# Patient Record
Sex: Male | Born: 1940 | Race: White | Hispanic: No | Marital: Married | Smoking: Current every day smoker
Health system: Southern US, Community
[De-identification: ages and names within clinical notes are randomized; demographics above are authoritative.]

## PROBLEM LIST (undated history)

## (undated) DIAGNOSIS — N200 Calculus of kidney: Secondary | ICD-10-CM

## (undated) DIAGNOSIS — F329 Major depressive disorder, single episode, unspecified: Secondary | ICD-10-CM

## (undated) DIAGNOSIS — F32A Depression, unspecified: Secondary | ICD-10-CM

## (undated) DIAGNOSIS — I639 Cerebral infarction, unspecified: Secondary | ICD-10-CM

## (undated) DIAGNOSIS — I2510 Atherosclerotic heart disease of native coronary artery without angina pectoris: Secondary | ICD-10-CM

## (undated) DIAGNOSIS — E785 Hyperlipidemia, unspecified: Secondary | ICD-10-CM

## (undated) DIAGNOSIS — F4310 Post-traumatic stress disorder, unspecified: Secondary | ICD-10-CM

## (undated) DIAGNOSIS — IMO0002 Reserved for concepts with insufficient information to code with codable children: Secondary | ICD-10-CM

## (undated) DIAGNOSIS — I10 Essential (primary) hypertension: Secondary | ICD-10-CM

## (undated) HISTORY — PX: CORONARY ANGIOPLASTY WITH STENT PLACEMENT: SHX49

---

## 2002-07-22 ENCOUNTER — Encounter: Payer: Self-pay | Admitting: Cardiology

## 2002-07-22 ENCOUNTER — Ambulatory Visit (HOSPITAL_COMMUNITY): Admission: RE | Admit: 2002-07-22 | Discharge: 2002-07-22 | Payer: Self-pay | Admitting: Cardiology

## 2004-05-29 ENCOUNTER — Emergency Department (HOSPITAL_COMMUNITY): Admission: EM | Admit: 2004-05-29 | Discharge: 2004-05-29 | Payer: Self-pay | Admitting: Emergency Medicine

## 2004-07-06 ENCOUNTER — Emergency Department (HOSPITAL_COMMUNITY): Admission: EM | Admit: 2004-07-06 | Discharge: 2004-07-06 | Payer: Self-pay | Admitting: Emergency Medicine

## 2004-07-19 ENCOUNTER — Emergency Department (HOSPITAL_COMMUNITY): Admission: EM | Admit: 2004-07-19 | Discharge: 2004-07-20 | Payer: Self-pay | Admitting: Emergency Medicine

## 2004-07-20 ENCOUNTER — Inpatient Hospital Stay (HOSPITAL_COMMUNITY): Admission: EM | Admit: 2004-07-20 | Discharge: 2004-07-23 | Payer: Self-pay | Admitting: Cardiology

## 2011-09-18 DIAGNOSIS — N200 Calculus of kidney: Secondary | ICD-10-CM

## 2011-09-18 HISTORY — DX: Calculus of kidney: N20.0

## 2011-10-19 ENCOUNTER — Encounter (HOSPITAL_BASED_OUTPATIENT_CLINIC_OR_DEPARTMENT_OTHER): Payer: Self-pay | Admitting: *Deleted

## 2011-10-19 ENCOUNTER — Observation Stay (HOSPITAL_BASED_OUTPATIENT_CLINIC_OR_DEPARTMENT_OTHER)
Admission: EM | Admit: 2011-10-19 | Discharge: 2011-10-24 | Disposition: A | Discharge status: Home / Self Care | Payer: Medicare Other | Attending: Family Medicine | Admitting: Family Medicine

## 2011-10-19 ENCOUNTER — Emergency Department (INDEPENDENT_AMBULATORY_CARE_PROVIDER_SITE_OTHER): Payer: Medicare Other

## 2011-10-19 ENCOUNTER — Other Ambulatory Visit: Payer: Self-pay

## 2011-10-19 DIAGNOSIS — F419 Anxiety disorder, unspecified: Secondary | ICD-10-CM | POA: Diagnosis present

## 2011-10-19 DIAGNOSIS — Z8673 Personal history of transient ischemic attack (TIA), and cerebral infarction without residual deficits: Secondary | ICD-10-CM | POA: Insufficient documentation

## 2011-10-19 DIAGNOSIS — J449 Chronic obstructive pulmonary disease, unspecified: Secondary | ICD-10-CM | POA: Insufficient documentation

## 2011-10-19 DIAGNOSIS — F32A Depression, unspecified: Secondary | ICD-10-CM | POA: Diagnosis present

## 2011-10-19 DIAGNOSIS — N200 Calculus of kidney: Secondary | ICD-10-CM

## 2011-10-19 DIAGNOSIS — I2589 Other forms of chronic ischemic heart disease: Secondary | ICD-10-CM | POA: Insufficient documentation

## 2011-10-19 DIAGNOSIS — I252 Old myocardial infarction: Secondary | ICD-10-CM | POA: Insufficient documentation

## 2011-10-19 DIAGNOSIS — R079 Chest pain, unspecified: Secondary | ICD-10-CM

## 2011-10-19 DIAGNOSIS — Z87442 Personal history of urinary calculi: Secondary | ICD-10-CM | POA: Insufficient documentation

## 2011-10-19 DIAGNOSIS — I959 Hypotension, unspecified: Secondary | ICD-10-CM

## 2011-10-19 DIAGNOSIS — I200 Unstable angina: Secondary | ICD-10-CM | POA: Diagnosis present

## 2011-10-19 DIAGNOSIS — F329 Major depressive disorder, single episode, unspecified: Secondary | ICD-10-CM | POA: Diagnosis present

## 2011-10-19 DIAGNOSIS — Z9861 Coronary angioplasty status: Secondary | ICD-10-CM | POA: Insufficient documentation

## 2011-10-19 DIAGNOSIS — I639 Cerebral infarction, unspecified: Secondary | ICD-10-CM | POA: Diagnosis present

## 2011-10-19 DIAGNOSIS — E785 Hyperlipidemia, unspecified: Secondary | ICD-10-CM | POA: Diagnosis present

## 2011-10-19 DIAGNOSIS — I5020 Unspecified systolic (congestive) heart failure: Secondary | ICD-10-CM | POA: Insufficient documentation

## 2011-10-19 DIAGNOSIS — J45909 Unspecified asthma, uncomplicated: Secondary | ICD-10-CM | POA: Diagnosis present

## 2011-10-19 DIAGNOSIS — J4489 Other specified chronic obstructive pulmonary disease: Secondary | ICD-10-CM | POA: Insufficient documentation

## 2011-10-19 DIAGNOSIS — I2510 Atherosclerotic heart disease of native coronary artery without angina pectoris: Principal | ICD-10-CM

## 2011-10-19 DIAGNOSIS — F4310 Post-traumatic stress disorder, unspecified: Secondary | ICD-10-CM | POA: Diagnosis present

## 2011-10-19 DIAGNOSIS — IMO0002 Reserved for concepts with insufficient information to code with codable children: Secondary | ICD-10-CM | POA: Insufficient documentation

## 2011-10-19 DIAGNOSIS — F3289 Other specified depressive episodes: Secondary | ICD-10-CM | POA: Insufficient documentation

## 2011-10-19 DIAGNOSIS — I10 Essential (primary) hypertension: Secondary | ICD-10-CM | POA: Insufficient documentation

## 2011-10-19 DIAGNOSIS — I059 Rheumatic mitral valve disease, unspecified: Secondary | ICD-10-CM | POA: Insufficient documentation

## 2011-10-19 HISTORY — DX: Atherosclerotic heart disease of native coronary artery without angina pectoris: I25.10

## 2011-10-19 HISTORY — DX: Calculus of kidney: N20.0

## 2011-10-19 HISTORY — DX: Depression, unspecified: F32.A

## 2011-10-19 HISTORY — DX: Hyperlipidemia, unspecified: E78.5

## 2011-10-19 HISTORY — DX: Essential (primary) hypertension: I10

## 2011-10-19 HISTORY — DX: Reserved for concepts with insufficient information to code with codable children: IMO0002

## 2011-10-19 HISTORY — DX: Major depressive disorder, single episode, unspecified: F32.9

## 2011-10-19 HISTORY — DX: Cerebral infarction, unspecified: I63.9

## 2011-10-19 LAB — BASIC METABOLIC PANEL
BUN: 16 mg/dL (ref 6–23)
CO2: 25 mEq/L (ref 19–32)
Calcium: 9.4 mg/dL (ref 8.4–10.5)
Chloride: 103 mEq/L (ref 96–112)
Creatinine, Ser: 1.2 mg/dL (ref 0.50–1.35)
GFR calc Af Amer: 69 mL/min — ABNORMAL LOW (ref >90)
GFR calc non Af Amer: 60 mL/min — ABNORMAL LOW (ref >90)
Glucose, Bld: 123 mg/dL — ABNORMAL HIGH (ref 70–99)
Potassium: 3.5 mEq/L (ref 3.5–5.1)
Sodium: 139 mEq/L (ref 135–145)

## 2011-10-19 LAB — CBC
HCT: 42.3 % (ref 39.0–52.0)
Hemoglobin: 14.6 g/dL (ref 13.0–17.0)
MCH: 30.7 pg (ref 26.0–34.0)
MCHC: 34.5 g/dL (ref 30.0–36.0)
MCV: 89.1 fL (ref 78.0–100.0)
Platelets: 209 10*3/uL (ref 150–400)
RBC: 4.75 MIL/uL (ref 4.22–5.81)
RDW: 13.7 % (ref 11.5–15.5)
WBC: 8.8 10*3/uL (ref 4.0–10.5)

## 2011-10-19 LAB — TROPONIN I: Troponin I: <0.3 ng/mL (ref <0.30)

## 2011-10-19 MED ORDER — ONDANSETRON HCL 4 MG/2ML IJ SOLN: 4.0000 mg | Freq: Four times a day (QID) | INTRAMUSCULAR | Status: DC | PRN

## 2011-10-19 MED ORDER — ACETAMINOPHEN 650 MG RE SUPP: 650.0000 mg | Freq: Four times a day (QID) | RECTAL | Status: DC | PRN

## 2011-10-19 MED ORDER — ONDANSETRON HCL 4 MG PO TABS: 4.0000 mg | ORAL_TABLET | Freq: Four times a day (QID) | ORAL | Status: DC | PRN

## 2011-10-19 MED ORDER — ZOLPIDEM TARTRATE 5 MG PO TABS: 5.0000 mg | ORAL_TABLET | Freq: Every evening | ORAL | Status: DC | PRN

## 2011-10-19 MED ORDER — ALUM & MAG HYDROXIDE-SIMETH 200-200-20 MG/5ML PO SUSP: 30.0000 mL | Freq: Four times a day (QID) | ORAL | Status: DC | PRN

## 2011-10-19 MED ORDER — NITROGLYCERIN 2 % TD OINT
0.5000 [in_us] | TOPICAL_OINTMENT | Freq: Four times a day (QID) | TRANSDERMAL | Status: DC
  Filled 2011-10-19: qty 30

## 2011-10-19 MED ORDER — ACETAMINOPHEN 325 MG PO TABS
650.0000 mg | ORAL_TABLET | Freq: Four times a day (QID) | ORAL | Status: DC | PRN
  Filled 2011-10-19: qty 2

## 2011-10-19 MED ORDER — MORPHINE SULFATE 4 MG/ML IJ SOLN
4.0000 mg | Freq: Once | INTRAMUSCULAR | Status: AC
  Administered 2011-10-19: 4 mg via INTRAVENOUS
  Filled 2011-10-19: qty 1

## 2011-10-19 MED ORDER — SODIUM CHLORIDE 0.9 % IV SOLN: INTRAVENOUS | Status: DC

## 2011-10-19 MED ORDER — HYDROMORPHONE HCL PF 1 MG/ML IJ SOLN
0.5000 mg | INTRAMUSCULAR | Status: DC | PRN
  Administered 2011-10-20: 0.5 mg via INTRAVENOUS
  Administered 2011-10-21: 1 mg via INTRAVENOUS
  Filled 2011-10-19 (×2): qty 1

## 2011-10-19 MED ORDER — ENOXAPARIN SODIUM 40 MG/0.4ML ~~LOC~~ SOLN
40.0000 mg | SUBCUTANEOUS | Status: DC
  Administered 2011-10-19: 40 mg via SUBCUTANEOUS
  Filled 2011-10-19 (×2): qty 0.4

## 2011-10-19 MED ORDER — SODIUM CHLORIDE 0.9 % IV SOLN
INTRAVENOUS | Status: DC
  Administered 2011-10-19 – 2011-10-20 (×3): via INTRAVENOUS

## 2011-10-19 MED ORDER — ASPIRIN 81 MG PO CHEW
324.0000 mg | CHEWABLE_TABLET | Freq: Once | ORAL | Status: AC
  Administered 2011-10-19: 324 mg via ORAL
  Filled 2011-10-19: qty 4

## 2011-10-19 MED ORDER — OXYCODONE HCL 5 MG PO TABS
5.0000 mg | ORAL_TABLET | ORAL | Status: DC | PRN
  Administered 2011-10-19 – 2011-10-24 (×21): 5 mg via ORAL
  Filled 2011-10-19 (×21): qty 1

## 2011-10-19 MED ORDER — ONDANSETRON HCL 4 MG/2ML IJ SOLN: 4.0000 mg | Freq: Three times a day (TID) | INTRAMUSCULAR | Status: DC | PRN

## 2011-10-19 MED ORDER — ASPIRIN EC 325 MG PO TBEC
325.0000 mg | DELAYED_RELEASE_TABLET | Freq: Every day | ORAL | Status: DC
  Administered 2011-10-20 – 2011-10-24 (×5): 325 mg via ORAL
  Filled 2011-10-19 (×5): qty 1

## 2011-10-19 MED ORDER — NITROGLYCERIN 0.2 MG/HR TD PT24
0.2000 mg | MEDICATED_PATCH | Freq: Every day | TRANSDERMAL | Status: DC
  Administered 2011-10-19 – 2011-10-24 (×6): 0.2 mg via TRANSDERMAL
  Filled 2011-10-19 (×6): qty 1

## 2011-10-19 NOTE — ED Notes (Signed)
[facility]ia [facility]--spoke with Mitchell--Hospitalist-for dr. Juleen China

## 2011-10-19 NOTE — ED Notes (Signed)
Chest pain onset 3 hours ago 

## 2011-10-19 NOTE — ED Notes (Signed)
Report called to RN on [id number].

## 2011-10-19 NOTE — ED Notes (Signed)
Pt. Reports he gets depressed when he thinks of his friends from [place]. Then he begins to cry and talk about sad things.

## 2011-10-19 NOTE — ED Notes (Signed)
Pt. Reports he had a stroke and no noted deficits.

## 2011-10-19 NOTE — ED Notes (Signed)
Pt. 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  Pt. Reports he has been having chest pain on the L side with c/o the pain "doubling me over".  This per Pt.  No distress noted in Pt. And no pain at present time per pt.

## 2011-10-19 NOTE — ED Notes (Signed)
Pt. Report given to [facility] 

## 2011-10-19 NOTE — H&P (Addendum)
DATE OF ADMISSION:  10/19/2011  PCP:  [facility] in [facility],  Dr. Sherrie Mustache    Chief Complaint:  Chest Pain   HPI: Neil Bass is an 71 y.o. male  With a history of CAD who presented to the [facility] ED with complaints of Chest Pain which started at 4:30 pm while he was driving.  He felt sharp throbbing pain in the left chest and rated the pain as a 10/10, he states he almost doubled over because the pain was so bad.  He had associated SOB, denied having nausea or vomiting or diaphoresis.  He states the pain lasted a few seconds.  He does have a history of CAD, and had 2 stents that were placed. He was concerned because he had been off of his plavix therapy this past week because he underwent kidney stone removal this week.     Past Medical History  Diagnosis Date  . Coronary artery disease   . Hypertension   . Kidney stone   . Kidney stones 2013  . Hyperlipidemia   . Degenerative disk disease   . Asthma     uses albuterol every once in a while  . Stroke     no residual  . Depression     new onset r/t loss of friends    Past Surgical History  Procedure Date  . Cardiac surgery   . Coronary angioplasty with stent placement 2005, 2009    Medications:  HOME MEDS: Prior to Admission medications   Medication Sig Start Date End Date Taking? Authorizing Provider  nitroGLYCERIN (NITROSTAT) 0.4 MG SL tablet Place 0.4 mg under the tongue every 5 (five) minutes as needed. For chest pain   Yes Historical Provider, MD  PRESCRIPTION MEDICATION Patient takes cholesterol, high blood pressure, and pain medications.   Yes Historical Provider, MD    Allergies:  No Known Allergies  Social History: Smokes 1 ppd X 50 years.    reports that he has been smoking.  He does not have any smokeless tobacco history on file. He reports that he does not drink alcohol or use illicit drugs.  Family History: Positive for Lung Cancer  Review of Systems:  The patient denies anorexia,  fever, weight loss, vision loss, decreased hearing, hoarseness,syncope, dyspnea on exertion, peripheral edema, balance deficits, hemoptysis, abdominal pain, melena, hematochezia, severe indigestion/heartburn, hematuria, incontinence, genital sores, muscle weakness, suspicious skin lesions, transient blindness, difficulty walking, depression, unusual weight change, abnormal bleeding, enlarged lymph nodes, angioedema, and breast masses.   Physical Exam:  GEN:  Pleasant examined  and in no acute distress; cooperative with exam Filed Vitals:   10/19/11 1818 10/19/11 1920  BP: 128/94 129/85  Pulse: 98 99  Temp: 98.5 F (36.9 C)   TempSrc: Oral   Resp: 20   Weight: 69.4 kg (153 lb)   SpO2: 100% 100%   Blood pressure 129/85, pulse 99, temperature 98.5 F (36.9 C), temperature source Oral, resp. rate 20, weight 69.4 kg (153 lb), SpO2 100.00%. PSYCH: He is alert and oriented x4; does not appear anxious does not appear depressed; affect is normal HEENT: Normocephalic and Atraumatic, Mucous membranes pink; PERRLA; EOM intact; Fundi:  Benign;  No scleral icterus, Nares: Patent, Oropharynx: Clear. Neck:  FROM, no cervical lymphadenopathy nor thyromegaly or carotid bruit; no JVD; Breasts:: Not examined CHEST WALL: No tenderness CHEST: Normal respiration, clear to auscultation bilaterally HEART: Regular rate and rhythm; no murmurs rubs or gallops BACK: No kyphosis or scoliosis; no  CVA tenderness ABDOMEN: Positive Bowel Sounds, soft non-tender; no masses, no organomegaly. Rectal Exam: Not done EXTREMITIES: No cyanosis, clubbing or edema; no ulcerations. Genitalia: not examined PULSES: 2+ and symmetric SKIN: Normal hydration no rash or ulceration CNS: Cranial nerves 2-12 grossly intact no focal neurologic deficit   Labs & Imaging Results for orders placed during the hospital encounter of 10/19/11 (from the past 48 hour(s))  TROPONIN I     Status: Normal   Collection Time   10/19/11  6:42 PM       Component Value Range Comment   Troponin I <0.30  <0.30 (ng/mL)   BASIC METABOLIC PANEL     Status: Abnormal   Collection Time   10/19/11  6:42 PM      Component Value Range Comment   Sodium 139  135 - 145 (mEq/L)    Potassium 3.5  3.5 - 5.1 (mEq/L)    Chloride 103  96 - 112 (mEq/L)    CO2 25  19 - 32 (mEq/L)    Glucose, Bld 123 (*) 70 - 99 (mg/dL)    BUN 16  6 - 23 (mg/dL)    Creatinine, Ser 8.41  0.50 - 1.35 (mg/dL)    Calcium 9.4  8.4 - 10.5 (mg/dL)    GFR calc non Af Amer 60 (*) >90 (mL/min)    GFR calc Af Amer 69 (*) >90 (mL/min)   CBC     Status: Normal   Collection Time   10/19/11  6:42 PM      Component Value Range Comment   WBC 8.8  4.0 - 10.5 (K/uL)    RBC 4.75  4.22 - 5.81 (MIL/uL)    Hemoglobin 14.6  13.0 - 17.0 (g/dL)    HCT 32.4  40.1 - 02.7 (%)    MCV 89.1  78.0 - 100.0 (fL)    MCH 30.7  26.0 - 34.0 (pg)    MCHC 34.5  30.0 - 36.0 (g/dL)    RDW 25.3  66.4 - 40.3 (%)    Platelets 209  150 - 400 (K/uL)    Dg Chest 2 View  10/19/2011  *RADIOLOGY REPORT*  Clinical Data: Left-sided chest pain.  CHEST - 2 VIEW  Comparison:  07/19/2004  Findings:  The heart size and mediastinal contours are within normal limits.  Both lungs are clear.  Old distal left clavicle fracture deformity again noted.  IMPRESSION: Stable exam.  No active cardiopulmonary disease.  Original Report Authenticated By: Danae Orleans, M.D.      Assessment: 1.  Chest Pain 2.  CAD 3.  HTN 4. Hyperlipidemia  Plan:    Admit for 23 hour Observation to a telemetry bed, and cardiac enzymes have been ordered.  Patient has been placed on a Nitroglycerin patch since nitropaste is on backorder, and he will also be place don Aspirin therapy and Oxygen.  His regular medications will need to be further verified and DVT prophylaxis has been ordered.  Tobacco Cessation counselling ordered.  Other plans as per orders.    CODE STATUS:      FULL CODE        Isabeau Mccalla C 10/19/2011, 10:19 PM

## 2011-10-19 NOTE — ED Provider Notes (Signed)
History    71 year old male with chest pain. Cute onset while driving. Substernal. Describes the pain as achy to sharp no radiation. Associated with dyspnea. Onset was about an hour prior to arrival and still has this pain. Took a nitroglycerin without much change in symptoms. No palpitations, diaphoresis or nausea. Does have a known history of coronary artery disease. Status post stenting years ago in [place].. Recently stopped taking his Plavix briefly for a lithotripsy. Denies history of blood clot. No unusual leg pain or swelling.  CSN: [id number]  Arrival date & time 10/19/11  1816   First MD Initiated Contact with Patient 10/19/11 1819      Chief Complaint  Patient presents with  . Chest Pain    (Consider location/radiation/quality/duration/timing/severity/associated sxs/prior treatment) HPI  Past Medical History  Diagnosis Date  . Coronary artery disease   . Hypertension   . Stroke   . Kidney stone     Past Surgical History  Procedure Date  . Cardiac surgery     History reviewed. No pertinent family history.  History  Substance Use Topics  . Smoking status: Current Everyday Smoker  . Smokeless tobacco: Not on file  . Alcohol Use: No      Review of Systems   Review of symptoms negative unless otherwise noted in HPI.   Allergies  Review of patient's allergies indicates no known allergies.  Home Medications   Current Outpatient Rx  Name Route Sig Dispense Refill  . NITROGLYCERIN 0.4 MG SL SUBL Sublingual Place 0.4 mg under the tongue every 5 (five) minutes as needed. For chest pain    . PRESCRIPTION MEDICATION  Patient takes cholesterol, high blood pressure, and pain medications.      BP 128/94  Pulse 98  Temp(Src) 98.5 F (36.9 C) (Oral)  Resp 20  Wt 153 lb (69.4 kg)  SpO2 100%  Physical Exam  Nursing note and vitals reviewed. Constitutional: He appears well-developed and well-nourished. No distress.       Sitting up in bed. No acute  distress  HENT:  Head: Normocephalic and atraumatic.  Eyes: Conjunctivae are normal. Right eye exhibits no discharge. Left eye exhibits no discharge.  Neck: Neck supple.  Cardiovascular: Normal rate, regular rhythm and normal heart sounds.  Exam reveals no gallop and no friction rub.   No murmur heard.      Chest pain is not reproducible  Pulmonary/Chest: Effort normal and breath sounds normal. No respiratory distress.  Abdominal: Soft. He exhibits no distension. There is no tenderness.  Musculoskeletal: He exhibits no edema and no tenderness.  Neurological: He is alert.  Skin: Skin is warm and dry.  Psychiatric: He has a normal mood and affect. His behavior is normal. Thought content normal.    ED Course  Procedures (including critical care time)  Labs Reviewed  BASIC METABOLIC PANEL - Abnormal; Notable for the following:    Glucose, Bld 123 (*)    GFR calc non Af Amer 60 (*)    GFR calc Af Amer 69 (*)    All other components within normal limits  TROPONIN I  CBC   Dg Chest 2 View  10/19/2011  *RADIOLOGY REPORT*  Clinical Data: Left-sided chest pain.  CHEST - 2 VIEW  Comparison:  07/19/2004  Findings:  The heart size and mediastinal contours are within normal limits.  Both lungs are clear.  Old distal left clavicle fracture deformity again noted.  IMPRESSION: Stable exam.  No active cardiopulmonary disease.  Original Report Authenticated By:  Danae Orleans, M.D.     EKG: 6:32 PM Rhythm: Sinus tachycardia Rate: 100  Axis: Normal Intervals: Right bundle branch block. Q waves inferiorly.  ST segments: T wave inversions in inferior leads Comparison: Inferior Q waves are new from comparison from November 2005   1. Chest pain       MDM  71 year old male with chest pain. Known CAD and concern for ACS. EKG with new Q waves inferiorly, although comparison is from several years ago. Nonspecific t wave changes. Troponin is within normal limits. CXR with no acute abnormality. CBC  and BMP unremarkable. This is not low-risk given known CAD and concerning history. Pt receives medical care from [place]. Says no local cardiologist but per review of records has been previously evaluated by [facility] in 2005. Will admit for rule out and further evaluation.         Raeford Razor, MD 10/19/11 [phone]

## 2011-10-20 ENCOUNTER — Other Ambulatory Visit: Payer: Self-pay

## 2011-10-20 DIAGNOSIS — F329 Major depressive disorder, single episode, unspecified: Secondary | ICD-10-CM | POA: Diagnosis present

## 2011-10-20 DIAGNOSIS — E785 Hyperlipidemia, unspecified: Secondary | ICD-10-CM | POA: Diagnosis present

## 2011-10-20 DIAGNOSIS — I2510 Atherosclerotic heart disease of native coronary artery without angina pectoris: Secondary | ICD-10-CM

## 2011-10-20 DIAGNOSIS — N200 Calculus of kidney: Secondary | ICD-10-CM

## 2011-10-20 DIAGNOSIS — J45909 Unspecified asthma, uncomplicated: Secondary | ICD-10-CM | POA: Diagnosis present

## 2011-10-20 DIAGNOSIS — I639 Cerebral infarction, unspecified: Secondary | ICD-10-CM | POA: Diagnosis present

## 2011-10-20 LAB — CARDIAC PANEL(CRET KIN+CKTOT+MB+TROPI)
CK, MB: 3.4 ng/mL (ref 0.3–4.0)
CK, MB: 3.3 ng/mL (ref 0.3–4.0)
Relative Index: INVALID (ref 0.0–2.5)
Relative Index: INVALID (ref 0.0–2.5)
Total CK: 64 U/L (ref 7–232)
Total CK: 60 U/L (ref 7–232)
Troponin I: <0.3 ng/mL (ref <0.30)

## 2011-10-20 LAB — BASIC METABOLIC PANEL
Chloride: 107 mEq/L (ref 96–112)
GFR calc Af Amer: 84 mL/min — ABNORMAL LOW (ref >90)
GFR calc non Af Amer: 72 mL/min — ABNORMAL LOW (ref >90)
Glucose, Bld: 116 mg/dL — ABNORMAL HIGH (ref 70–99)
Potassium: 3.5 mEq/L (ref 3.5–5.1)
Sodium: 138 mEq/L (ref 135–145)

## 2011-10-20 LAB — CBC
HCT: 38.4 % — ABNORMAL LOW (ref 39.0–52.0)
Hemoglobin: 12.9 g/dL — ABNORMAL LOW (ref 13.0–17.0)
RBC: 4.26 MIL/uL (ref 4.22–5.81)

## 2011-10-20 MED ORDER — LORAZEPAM 0.5 MG PO TABS
0.5000 mg | ORAL_TABLET | Freq: Three times a day (TID) | ORAL | Status: DC | PRN
  Administered 2011-10-20: 0.5 mg via ORAL
  Filled 2011-10-20 (×2): qty 1

## 2011-10-20 MED ORDER — ENOXAPARIN SODIUM 80 MG/0.8ML ~~LOC~~ SOLN
70.0000 mg | Freq: Two times a day (BID) | SUBCUTANEOUS | Status: DC
  Administered 2011-10-20 – 2011-10-23 (×8): 70 mg via SUBCUTANEOUS
  Filled 2011-10-20 (×10): qty 0.8

## 2011-10-20 MED ORDER — PANTOPRAZOLE SODIUM 40 MG PO TBEC
40.0000 mg | DELAYED_RELEASE_TABLET | Freq: Every day | ORAL | Status: DC
  Administered 2011-10-20 – 2011-10-22 (×3): 40 mg via ORAL
  Filled 2011-10-20 (×3): qty 1

## 2011-10-20 MED ORDER — METOPROLOL TARTRATE 12.5 MG HALF TABLET
12.5000 mg | ORAL_TABLET | Freq: Two times a day (BID) | ORAL | Status: DC
  Administered 2011-10-20 – 2011-10-22 (×6): 12.5 mg via ORAL
  Filled 2011-10-20 (×8): qty 1

## 2011-10-20 MED ORDER — CLOPIDOGREL BISULFATE 75 MG PO TABS
75.0000 mg | ORAL_TABLET | Freq: Every day | ORAL | Status: DC
  Administered 2011-10-20 – 2011-10-24 (×5): 75 mg via ORAL
  Filled 2011-10-20 (×5): qty 1

## 2011-10-20 MED ORDER — ENOXAPARIN SODIUM 80 MG/0.8ML ~~LOC~~ SOLN: 80.0000 mg | Freq: Two times a day (BID) | SUBCUTANEOUS | Status: DC

## 2011-10-20 MED ORDER — POTASSIUM CHLORIDE CRYS ER 20 MEQ PO TBCR
40.0000 meq | EXTENDED_RELEASE_TABLET | Freq: Once | ORAL | Status: AC
  Administered 2011-10-20: 40 meq via ORAL
  Filled 2011-10-20: qty 2

## 2011-10-20 NOTE — Progress Notes (Signed)
Multiple phone calls from wife and mother in law, pt extremely upset reports he feels suicidal and wishes he was dead. Denies having a plan in place. He also reports he feels like he is having a nervous breakdown and can he have anything to help his nerves. Pt. Placed on suicide watch room searched and sitter in place. Belongings removed. MD notified new orders noted.

## 2011-10-20 NOTE — Progress Notes (Signed)
ANTICOAGULATION CONSULT NOTE - Initial Consult  Pharmacy Consult for Lovenox Indication: chest pain/ACS  No Known Allergies  Patient Measurements: Height: 6' (182.9 cm) Weight: 153 lb (69.4 kg) IBW/kg (Calculated) : 77.6    Vital Signs: Temp: 98.4 F (36.9 C) (02/02 0608) Temp src: Oral (02/02 0608) BP: 135/65 mmHg (02/02 1222) Pulse Rate: 79  (02/02 1222)  Labs:  Basename 10/20/11 0500 10/20/11 0046 10/19/11 1842  HGB 12.9* -- 14.6  HCT 38.4* -- 42.3  PLT 210 -- 209  APTT -- -- --  LABPROT -- -- --  INR -- -- --  HEPARINUNFRC -- -- --  CREATININE 1.03 -- 1.20  CKTOTAL 60 64 --  CKMB 3.3 3.4 --  TROPONINI <0.30 <0.30 <0.30   Estimated Creatinine Clearance: 66.4 ml/min (by C-G formula based on Cr of 1.03).  Medical History: Past Medical History  Diagnosis Date  . Coronary artery disease   . Hypertension   . Kidney stone   . Kidney stones 2013  . Hyperlipidemia   . Degenerative disk disease   . Asthma     uses albuterol every once in a while  . Stroke     no residual  . Depression     new onset r/t loss of friends    Medications:  Prescriptions prior to admission  Medication Sig Dispense Refill  . nitroGLYCERIN (NITROSTAT) 0.4 MG SL tablet Place 0.4 mg under the tongue every 5 (five) minutes as needed. For chest pain      . PRESCRIPTION MEDICATION Patient takes cholesterol, high blood pressure, and pain medications.       Scheduled:    . aspirin  324 mg Oral Once  . aspirin EC  325 mg Oral Daily  . clopidogrel  75 mg Oral Q breakfast  . enoxaparin (LOVENOX) injection  70 mg Subcutaneous BID  . metoprolol tartrate  12.5 mg Oral BID  .  morphine injection  4 mg Intravenous Once  . nitroGLYCERIN  0.2 mg Transdermal Daily  . pantoprazole  40 mg Oral Q1200  . potassium chloride  40 mEq Oral Once  . DISCONTD: enoxaparin  40 mg Subcutaneous Q24H  . DISCONTD: enoxaparin  80 mg Subcutaneous Q12H  . DISCONTD: nitroGLYCERIN  0.5 inch Topical Q6H    Infusions:    . sodium chloride 75 mL/hr at 10/20/11 0616  . DISCONTD: sodium chloride     PRN: acetaminophen, acetaminophen, alum & mag hydroxide-simeth, HYDROmorphone, LORazepam, ondansetron (ZOFRAN) IV, ondansetron, oxyCODONE, zolpidem, DISCONTD: ondansetron (ZOFRAN) IV  Assessment: 71 yo male admitted with chest pain. Cardiologist has ordered full dose Lovenox for unstable angina. Lovenox 40mg  sq given last pm @23 :00. H/H decreased slightly, pltc stable. Scr 1.03, CrCl  61ml/min, weight = 69kg. No bleeding reported.     Plan:  Lovenox 70mg  SQ q12h.  Will monitor for s/sx of bleeding, follow renal function and adjust lovenox if needed.   Arman Filter 10/20/2011,12:28 PM

## 2011-10-20 NOTE — Progress Notes (Signed)
Subjective: Patient c/o jaw pain. Chest pain improved. No SOB  Objective: Vital signs in last 24 hours: Filed Vitals:   10/19/11 2155 10/20/11 0001 10/20/11 0400 10/20/11 0608  BP: 136/74 107/60 132/69 113/69  Pulse: 81 81 70 69  Temp: 97.7 F (36.5 C) 97.6 F (36.4 C) 97.9 F (36.6 C) 98.4 F (36.9 C)  TempSrc: Oral Oral Oral Oral  Resp: 20 20 18 20   Height: 6' (1.829 m)     Weight: 69.4 kg (153 lb)     SpO2: 98% 95% 95% 100%    Intake/Output Summary (Last 24 hours) at 10/20/11 1041 Last data filed at 10/20/11 0915  Gross per 24 hour  Intake    440 ml  Output    350 ml  Net     90 ml    Weight change:   General: Alert, awake, oriented x3, in no acute distress. Heart: Regular rate and rhythm, without murmurs, rubs, gallops. Lungs: Clear to auscultation bilaterally. Abdomen: Soft, nontender, nondistended, positive bowel sounds. Extremities: No clubbing cyanosis or edema with positive pedal pulses. Neuro: Grossly intact, nonfocal.    Lab Results:  Basename 10/20/11 0500 10/19/11 1842  NA 138 139  K 3.5 3.5  CL 107 103  CO2 24 25  GLUCOSE 116* 123*  BUN 15 16  CREATININE 1.03 1.20  CALCIUM 9.0 9.4  MG -- --  PHOS -- --   No results found for this basename: AST:2,ALT:2,ALKPHOS:2,BILITOT:2,PROT:2,ALBUMIN:2 in the last 72 hours No results found for this basename: LIPASE:2,AMYLASE:2 in the last 72 hours  Basename 10/20/11 0500 10/19/11 1842  WBC 7.6 8.8  NEUTROABS -- --  HGB 12.9* 14.6  HCT 38.4* 42.3  MCV 90.1 89.1  PLT 210 209    Basename 10/20/11 0500 10/20/11 0046 10/19/11 1842  CKTOTAL 60 64 --  CKMB 3.3 3.4 --  CKMBINDEX -- -- --  TROPONINI <0.30 <0.30 <0.30   No components found with this basename: POCBNP:3 No results found for this basename: DDIMER:2 in the last 72 hours No results found for this basename: HGBA1C:2 in the last 72 hours No results found for this basename: CHOL:2,HDL:2,LDLCALC:2,TRIG:2,CHOLHDL:2,LDLDIRECT:2 in the last 72  hours No results found for this basename: TSH,T4TOTAL,FREET3,T3FREE,THYROIDAB in the last 72 hours No results found for this basename: VITAMINB12:2,FOLATE:2,FERRITIN:2,TIBC:2,IRON:2,RETICCTPCT:2 in the last 72 hours  Micro Results: No results found for this or any previous visit (from the past 240 hour(s)).  Studies/Results: Dg Chest 2 View  10/19/2011  *RADIOLOGY REPORT*  Clinical Data: Left-sided chest pain.  CHEST - 2 VIEW  Comparison:  07/19/2004  Findings:  The heart size and mediastinal contours are within normal limits.  Both lungs are clear.  Old distal left clavicle fracture deformity again noted.  IMPRESSION: Stable exam.  No active cardiopulmonary disease.  Original Report Authenticated By: Danae Orleans, M.D.    Medications:     . aspirin  324 mg Oral Once  . aspirin EC  325 mg Oral Daily  . enoxaparin  40 mg Subcutaneous Q24H  .  morphine injection  4 mg Intravenous Once  . nitroGLYCERIN  0.2 mg Transdermal Daily  . potassium chloride  40 mEq Oral Once  . DISCONTD: nitroGLYCERIN  0.5 inch Topical Q6H    Assessment: Principal Problem:  *Chest pain Active Problems:  CAD (coronary artery disease)  Hyperlipidemia  Kidney calculus  Depression  Stroke  Asthma   Plan: #1 chest pain Unknown etiology. Patient currently having jaw pain. Patient does have a history of coronary  artery disease status post stents x2. Patient states that did have recent kidney stone removal and Plavix was held and recently resumed 2 days ago. Patient is currently on a nitroglycerin patch. Cardiac enzymes are pending. 2-D echo is pending. Check a fasting lipid panel the Continue oxygen, aspirin, will resume patient's Plavix. Will start low dose beta blocker. Consult with cardiology for further evaluation and management. #2 coronary artery disease status post stent times 2. See #1. #3 hyperlipidemia Check a fasting lipid panel. If LDL is greater than 70 we'll start a statin. #4  depression Stable. #5 history of stroke No residual deficits. Continue risk factor modification. Continue aspirin. #6 prophylaxis Lovenox for DVT prophylaxis. Protonix for GI prophylaxis.   LOS: 1 day   THOMPSON,DANIEL 10/20/2011, 10:41 AM

## 2011-10-20 NOTE — Progress Notes (Signed)
Pt complaining of 4 out of 10 throbbing jaw pain, similar to when he had his MI. EKG obtained. 0.5 mg Dilaudid given. Pt placed on 2L O2. VSS. Pt has nitro paste on. Still in pain at this time. Elray Mcgregor notified. No new orders. Will continue to monitor. Duwaine Maxin, RN

## 2011-10-20 NOTE — Consult Note (Signed)
Subjective:     Neil Bass is a 71 y.o. male who complains of  chest pain. This consultation was requested by Dr. Ramiro Harvest. Patient's symptoms last approximately 1 hours, and are intermittent episodes. He describes the symptoms as Jaw pain, occurring during rest. The symptoms are worsened by nothing. Previous treatments/evaluations include: sublingual nitro which is used and NTG patch. Cardiac risk factors: advanced age (older than 27 for men, 10 for women), dyslipidemia, hypertension, male gender and smoking/ tobacco exposure.  The following portions of the patient's history were reviewed and updated as appropriate: allergies, current medications, past family history, past medical history, past social history, past surgical history and problem list..  Review of Systems The patient denies anorexia, fever, weight loss, vision loss, decreased hearing, hoarseness,syncope, dyspnea on exertion, peripheral edema, balance deficits, hemoptysis, abdominal pain, melena, hematochezia, severe indigestion/heartburn, hematuria, incontinence, genital sores, muscle weakness, suspicious skin lesions, transient blindness, difficulty walking, depression, unusual weight change, abnormal bleeding, enlarged lymph nodes, angioedema, and breast masses.     Objective:     Physical Exam  General: He is alert and oriented x4; does not appear anxious does not appear depressed; affect is normal  HEENT: Normocephalic and Atraumatic, Mucous membranes pink; PERRLA; EOM intact; Fundi: Benign; No scleral icterus, Nares: Patent, Oropharynx: Clear.  Neck: FROM, no cervical lymphadenopathy nor thyromegaly or carotid bruit; no JVD;  CHEST WALL: No tenderness  CHEST: Normal respiration, clear to auscultation bilaterally  HEART: Regular rate and rhythm; no murmurs rubs or gallops  ABDOMEN: Positive Bowel Sounds, soft non-tender; no masses, no organomegaly.  EXTREMITIES: No cyanosis, clubbing or edema; no ulcerations.    PULSES: 2+ and symmetric  SKIN: Normal hydration no rash or ulceration  CNS: Cranial nerves 2-12 grossly intact no focal neurologic deficit   Cardiographics ECG: normal sinus rhythm, right bundle branch block, old inferior wall MI. No acute changes Echocardiogram: not done  Imaging Chest x-ray: normal   Lab Review  Normal CBC, BMP, CK/MB and T-I.    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   Hypertension  Kidney stones  Hyperlipidemia  COPD   Plan:    Agree with aspirin, plavix, B-blocker  Increase Lovenox to full dose  Consider nuclear stress test v/s cardiac cath.
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
## 2011-10-21 ENCOUNTER — Other Ambulatory Visit: Payer: Self-pay

## 2011-10-21 DIAGNOSIS — I359 Nonrheumatic aortic valve disorder, unspecified: Secondary | ICD-10-CM

## 2011-10-21 DIAGNOSIS — I200 Unstable angina: Secondary | ICD-10-CM | POA: Diagnosis present

## 2011-10-21 DIAGNOSIS — F4310 Post-traumatic stress disorder, unspecified: Secondary | ICD-10-CM | POA: Diagnosis present

## 2011-10-21 DIAGNOSIS — F419 Anxiety disorder, unspecified: Secondary | ICD-10-CM | POA: Diagnosis present

## 2011-10-21 LAB — BASIC METABOLIC PANEL
BUN: 12 mg/dL (ref 6–23)
CO2: 25 mEq/L (ref 19–32)
Chloride: 107 mEq/L (ref 96–112)
GFR calc Af Amer: >90 mL/min (ref >90)
Glucose, Bld: 95 mg/dL (ref 70–99)
Potassium: 4.6 mEq/L (ref 3.5–5.1)

## 2011-10-21 LAB — CBC
HCT: 39.8 % (ref 39.0–52.0)
Hemoglobin: 13.2 g/dL (ref 13.0–17.0)
MCV: 91.9 fL (ref 78.0–100.0)
RBC: 4.33 MIL/uL (ref 4.22–5.81)
RDW: 13.8 % (ref 11.5–15.5)
WBC: 8.2 10*3/uL (ref 4.0–10.5)

## 2011-10-21 LAB — LIPID PANEL
HDL: 30 mg/dL — ABNORMAL LOW (ref >39)
Total CHOL/HDL Ratio: 4.5 RATIO
VLDL: 37 mg/dL (ref 0–40)

## 2011-10-21 MED ORDER — LORAZEPAM 0.5 MG PO TABS: 0.5000 mg | ORAL_TABLET | Freq: Two times a day (BID) | ORAL | Status: DC | PRN

## 2011-10-21 MED ORDER — PRAZOSIN HCL 1 MG PO CAPS
1.0000 mg | ORAL_CAPSULE | Freq: Every day | ORAL | Status: DC
  Administered 2011-10-22: 1 mg via ORAL
  Filled 2011-10-21 (×3): qty 1

## 2011-10-21 MED ORDER — CHLORPROMAZINE HCL 10 MG PO TABS
10.0000 mg | ORAL_TABLET | Freq: Three times a day (TID) | ORAL | Status: DC
  Administered 2011-10-21 – 2011-10-24 (×8): 10 mg via ORAL
  Filled 2011-10-21 (×10): qty 1

## 2011-10-21 MED ORDER — CHLORPROMAZINE HCL 25 MG PO TABS
25.0000 mg | ORAL_TABLET | Freq: Three times a day (TID) | ORAL | Status: DC
  Administered 2011-10-21 (×2): 25 mg via ORAL
  Filled 2011-10-21 (×3): qty 1

## 2011-10-21 NOTE — Progress Notes (Signed)
Subjective: Patient c/o jaw pain which is somewhat improved. Some CP last night, but improving per patient. Patient is tearful and denies SI or HI.   Objective: Vital signs in last 24 hours: Filed Vitals:   10/20/11 1222 10/20/11 1400 10/20/11 2100 10/21/11 0600  BP: 135/65 128/79 148/78 137/77  Pulse: 79 73 76 67  Temp:  98.2 F (36.8 C) 97.5 F (36.4 C) 98.4 F (36.9 C)  TempSrc:  Oral    Resp:  16 18 18   Height:      Weight:      SpO2:  96% 98% 97%    Intake/Output Summary (Last 24 hours) at 10/21/11 1610 Last data filed at 10/21/11 0700  Gross per 24 hour  Intake    800 ml  Output    900 ml  Net   -100 ml    Weight change:   General: Alert, awake, oriented x3, in no acute distress. Heart: Regular rate and rhythm, without murmurs, rubs, gallops. Lungs: Clear to auscultation bilaterally. Abdomen: Soft, nontender, nondistended, positive bowel sounds. Extremities: No clubbing cyanosis or edema with positive pedal pulses.    Lab Results:  Basename 10/20/11 0500 10/19/11 1842  NA 138 139  K 3.5 3.5  CL 107 103  CO2 24 25  GLUCOSE 116* 123*  BUN 15 16  CREATININE 1.03 1.20  CALCIUM 9.0 9.4  MG -- --  PHOS -- --   No results found for this basename: AST:2,ALT:2,ALKPHOS:2,BILITOT:2,PROT:2,ALBUMIN:2 in the last 72 hours No results found for this basename: LIPASE:2,AMYLASE:2 in the last 72 hours  Basename 10/20/11 0500 10/19/11 1842  WBC 7.6 8.8  NEUTROABS -- --  HGB 12.9* 14.6  HCT 38.4* 42.3  MCV 90.1 89.1  PLT 210 209    Basename 10/20/11 1600 10/20/11 0500 10/20/11 0046  CKTOTAL 57 60 64  CKMB 3.2 3.3 3.4  CKMBINDEX -- -- --  TROPONINI <0.30 <0.30 <0.30   No components found with this basename: POCBNP:3 No results found for this basename: DDIMER:2 in the last 72 hours No results found for this basename: HGBA1C:2 in the last 72 hours No results found for this basename: CHOL:2,HDL:2,LDLCALC:2,TRIG:2,CHOLHDL:2,LDLDIRECT:2 in the last 72 hours No  results found for this basename: TSH,T4TOTAL,FREET3,T3FREE,THYROIDAB in the last 72 hours No results found for this basename: VITAMINB12:2,FOLATE:2,FERRITIN:2,TIBC:2,IRON:2,RETICCTPCT:2 in the last 72 hours  Micro Results: No results found for this or any previous visit (from the past 240 hour(s)).  Studies/Results: Dg Chest 2 View  10/19/2011  *RADIOLOGY REPORT*  Clinical Data: Left-sided chest pain.  CHEST - 2 VIEW  Comparison:  07/19/2004  Findings:  The heart size and mediastinal contours are within normal limits.  Both lungs are clear.  Old distal left clavicle fracture deformity again noted.  IMPRESSION: Stable exam.  No active cardiopulmonary disease.  Original Report Authenticated By: Danae Orleans, M.D.    Medications:     . aspirin EC  325 mg Oral Daily  . chlorproMAZINE  25 mg Oral TID  . clopidogrel  75 mg Oral Q breakfast  . enoxaparin (LOVENOX) injection  70 mg Subcutaneous BID  . metoprolol tartrate  12.5 mg Oral BID  . nitroGLYCERIN  0.2 mg Transdermal Daily  . pantoprazole  40 mg Oral Q1200  . potassium chloride  40 mEq Oral Once  . prazosin  1 mg Oral QHS  . DISCONTD: enoxaparin  40 mg Subcutaneous Q24H  . DISCONTD: enoxaparin  80 mg Subcutaneous Q12H    Assessment: Principal Problem:  *Chest pain Active  Problems:  CAD (coronary artery disease)  Hyperlipidemia  Kidney calculus  Depression  Stroke  Asthma  Unstable angina  Post traumatic stress disorder (PTSD)  Anxiety   Plan: #1 chest pain Unknown etiology. Patient states some improvement with jaw pain. Some CP last night, none this AM. Patient does have a history of coronary artery disease status post stents x2. Patient states that did have recent kidney stone removal and Plavix was held and recently resumed 2 days ago. Patient is currently on a nitroglycerin patch. Cardiac enzymes are negative x 3. 2-D echo is pending. Fasting lipid panel pending. Continue oxygen, aspirin, Plavix, beta blocker.  Cardiology consulting and probable myoview stress test. Per cardiology. #2 coronary artery disease status post stent times 2. See #1. #3 hyperlipidemia Fasting lipid panel pending. If LDL is greater than 70 we'll start a statin. #4 depression Stable. #5 history of stroke No residual deficits. Continue risk factor modification. Continue aspirin. #6 PTSD/Anxiety Will start minipress for PTSD per psych rxcs and thorazine for anxiety. Will need outpatient f/u at [facility].Psych ff and appreciate input and rxcs. #7 prophylaxis Lovenox for DVT prophylaxis. Protonix for GI prophylaxis.   LOS: 2 days   [facility] 10/21/2011, 8:32 AM

## 2011-10-21 NOTE — Progress Notes (Signed)
Subjective:  No chest pain. Willing to go for stress test.  Objective:  Vital Signs in the last 24 hours: Temp:  [97.5 F (36.4 C)-98.5 F (36.9 C)] 98.5 F (36.9 C) (02/03 1356) Pulse Rate:  [67-78] 78  (02/03 1356) Cardiac Rhythm:  [-] Normal sinus rhythm (02/03 0809) Resp:  [18-20] 20  (02/03 1356) BP: (108-148)/(65-78) 108/65 mmHg (02/03 1356) SpO2:  [96 %-98 %] 96 % (02/03 1356)  Physical Exam: BP Readings from Last 1 Encounters:  10/21/11 108/65    Wt Readings from Last 1 Encounters:  10/19/11 69.4 kg (153 lb)    Weight change:   HEENT: [place]/AT, Eyes- PERL, EOMI, Conjunctiva-Pink, Sclera-Non-icteric Neck: No JVD, No bruit, Trachea midline. Lungs:  Clear, Bilateral. Cardiac:  Regular rhythm, normal S1 and S2, no S3.  Abdomen:  Soft, non-tender. Extremities:  No edema present. No cyanosis. No clubbing. CNS: AxOx3, Cranial nerves grossly intact, moves all 4 extremities. Right handed. Skin: Warm and dry.   Intake/Output from previous day: 02/02 0701 - 02/03 0700 In: 800 [P.O.:800] Out: 900 [Urine:900]    Lab Results: BMET    Component Value Date/Time   NA 139 10/21/2011 0731   K 4.6 10/21/2011 0731   CL 107 10/21/2011 0731   CO2 25 10/21/2011 0731   GLUCOSE 95 10/21/2011 0731   BUN 12 10/21/2011 0731   CREATININE 0.95 10/21/2011 0731   CALCIUM 9.1 10/21/2011 0731   GFRNONAA 83* 10/21/2011 0731   GFRAA >90 10/21/2011 0731   CBC    Component Value Date/Time   WBC 8.2 10/21/2011 0731   RBC 4.33 10/21/2011 0731   HGB 13.2 10/21/2011 0731   HCT 39.8 10/21/2011 0731   PLT 199 10/21/2011 0731   MCV 91.9 10/21/2011 0731   MCH 30.5 10/21/2011 0731   MCHC 33.2 10/21/2011 0731   RDW 13.8 10/21/2011 0731   CARDIAC ENZYMES Lab Results  Component Value Date   CKTOTAL 57 10/20/2011   CKMB 3.2 10/20/2011   TROPONINI <0.30 10/20/2011    Assessment/Plan:  Patient Active Hospital Problem List:  Unstable Angina,  Nuclear stress test Coronary angioplasty,  Coronary artery disease  Old M.I.    Hypertension  Kidney stones  Hyperlipidemia  COPD   LOS: 2 days    Orpah Cobb  MD  10/21/2011, 8:17 PM

## 2011-10-21 NOTE — Progress Notes (Signed)
*  PRELIMINARY RESULTS* Echocardiogram 2D Echocardiogram has been performed.  Clide Deutscher 10/21/2011, 9:08 AM

## 2011-10-21 NOTE — Consult Note (Addendum)
Pt seen, attending note appreciated. Thorazine has helped his anxiety. Cognition better able to self correct on serial sevens and rapidly preform 3 correct iterations.   Thinking clearer probably due to him not using any addictive substances today. Nurse notes he is more depressed today.  That could be due to the Thorazine, so reducing the dose to 10 mg would be my recommendations on that. The sitter probably ought to stay for at least tonight [employer] Bowl a bad trigger for his PTSD and reminder of his buddies he lost in [place]).  I will inform Dr Ferol Luz the weekday consult psychiatrist for her to follow.  He may need to be referred for substance abuse treatment when I process his response to his wife taking his pain pill bottle. Thank you for allowing me to share in the care of this patient. Dan Humphreys, Pamalee Marcoe 10/21/2011 4:41 PM

## 2011-10-22 ENCOUNTER — Observation Stay (HOSPITAL_COMMUNITY): Payer: Medicare Other

## 2011-10-22 LAB — BASIC METABOLIC PANEL
BUN: 14 mg/dL (ref 6–23)
Calcium: 9.4 mg/dL (ref 8.4–10.5)
Chloride: 105 mEq/L (ref 96–112)
Creatinine, Ser: 0.91 mg/dL (ref 0.50–1.35)
GFR calc Af Amer: >90 mL/min (ref >90)
GFR calc non Af Amer: 84 mL/min — ABNORMAL LOW (ref >90)

## 2011-10-22 LAB — GLUCOSE, CAPILLARY: Glucose-Capillary: 121 mg/dL — ABNORMAL HIGH (ref 70–99)

## 2011-10-22 MED ORDER — FENOFIBRATE 160 MG PO TABS
160.0000 mg | ORAL_TABLET | Freq: Every day | ORAL | Status: DC
  Administered 2011-10-22 – 2011-10-24 (×3): 160 mg via ORAL
  Filled 2011-10-22 (×3): qty 1

## 2011-10-22 MED ORDER — TECHNETIUM TC 99M TETROFOSMIN IV KIT
30.0000 | PACK | Freq: Once | INTRAVENOUS | Status: AC | PRN
  Administered 2011-10-22: 30 via INTRAVENOUS

## 2011-10-22 MED ORDER — NICOTINE 21 MG/24HR TD PT24
21.0000 mg | MEDICATED_PATCH | Freq: Every day | TRANSDERMAL | Status: DC
  Administered 2011-10-22 – 2011-10-23 (×2): 21 mg via TRANSDERMAL
  Filled 2011-10-22 (×4): qty 1

## 2011-10-22 MED ORDER — TECHNETIUM TC 99M TETROFOSMIN IV KIT
10.0000 | PACK | Freq: Once | INTRAVENOUS | Status: AC | PRN
  Administered 2011-10-22: 10 via INTRAVENOUS

## 2011-10-22 MED ORDER — REGADENOSON 0.4 MG/5ML IV SOLN
0.4000 mg | Freq: Once | INTRAVENOUS | Status: AC
  Administered 2011-10-22: 0.4 mg via INTRAVENOUS
  Filled 2011-10-22: qty 5

## 2011-10-22 NOTE — Progress Notes (Signed)
Subjective:  Patient denies any chest pain or shortness of breath Complaints of jaw pain denies any dental problems Schedule for flexible sigmoidoscopy and Myoview stress test today  Objective:  Vital Signs in the last 24 hours: Temp:  [97.6 F (36.4 C)-98.7 F (37.1 C)] 97.6 F (36.4 C) (02/04 0600) Pulse Rate:  [73-108] 98  (02/04 1139) Resp:  [14-20] 16  (02/04 0600) BP: (103-155)/(65-95) 132/86 mmHg (02/04 1139) SpO2:  [96 %-100 %] 97 % (02/04 0600)  Intake/Output from previous day: 02/03 0701 - 02/04 0700 In: 1120 [P.O.:1120] Out: 275 [Urine:275] Intake/Output from this shift:    Physical Exam: General appearance: alert and cooperative Neck: no adenopathy, no carotid bruit, no JVD and supple, symmetrical, trachea midline Lungs: clear to auscultation bilaterally Heart: regular rate and rhythm, S1, S2 normal and Soft systolic murmur noted no S3 or S4 gallop Abdomen: soft, non-tender; bowel sounds normal; no masses,  no organomegaly Extremities: extremities normal, atraumatic, no cyanosis or edema  Lab Results:  Basename 10/21/11 0731 10/20/11 0500  WBC 8.2 7.6  HGB 13.2 12.9*  PLT 199 210    Basename 10/22/11 0656 10/21/11 0731  NA 138 139  K 4.1 4.6  CL 105 107  CO2 26 25  GLUCOSE 101* 95  BUN 14 12  CREATININE 0.91 0.95    Basename 10/20/11 1600 10/20/11 0500  TROPONINI <0.30 <0.30   Hepatic Function Panel No results found for this basename: PROT,ALBUMIN,AST,ALT,ALKPHOS,BILITOT,BILIDIR,IBILI in the last 72 hours  Basename 10/21/11 0731  CHOL 134   No results found for this basename: PROTIME in the last 72 hours  Imaging: No results found.  Cardiac Studies:  Assessment/Plan:  Status post chest pain/jaw pain rule out coronary insufficiency Coronary artery disease history of MI in the past Hypertension History of CVA Hypercholesteremia Depression Posttraumatic stress disorder History of kidney stones Plan Continue present  management Schedule for nuclear stress test today .  LOS: 3 days    Arlena Marsan N 10/22/2011, 12:21 PM

## 2011-10-22 NOTE — Progress Notes (Signed)
Utilization review complete 

## 2011-10-22 NOTE — Progress Notes (Signed)
Subjective: Patient c/o jaw pain which is somewhat improved. No current CP.   Objective: Vital signs in last 24 hours: Filed Vitals:   10/21/11 1356 10/21/11 2000 10/21/11 2100 10/22/11 0600  BP: 108/65 103/69 135/74 111/71  Pulse: 78 94 74 73  Temp: 98.5 F (36.9 C) 98.7 F (37.1 C) 98.1 F (36.7 C) 97.6 F (36.4 C)  TempSrc: Oral     Resp: 20 16 14 16   Height:      Weight:      SpO2: 96% 100% 97% 97%    Intake/Output Summary (Last 24 hours) at 10/22/11 0739 Last data filed at 10/21/11 2300  Gross per 24 hour  Intake   1120 ml  Output    275 ml  Net    845 ml    Weight change:   General: Alert, awake, oriented x3, in no acute distress. Heart: Regular rate and rhythm, without murmurs, rubs, gallops. Lungs: Clear to auscultation bilaterally. Abdomen: Soft, nontender, nondistended, positive bowel sounds. Extremities: No clubbing cyanosis or edema with positive pedal pulses.    Lab Results:  Basename 10/21/11 0731 10/20/11 0500  NA 139 138  K 4.6 3.5  CL 107 107  CO2 25 24  GLUCOSE 95 116*  BUN 12 15  CREATININE 0.95 1.03  CALCIUM 9.1 9.0  MG -- --  PHOS -- --   No results found for this basename: AST:2,ALT:2,ALKPHOS:2,BILITOT:2,PROT:2,ALBUMIN:2 in the last 72 hours No results found for this basename: LIPASE:2,AMYLASE:2 in the last 72 hours  Basename 10/21/11 0731 10/20/11 0500  WBC 8.2 7.6  NEUTROABS -- --  HGB 13.2 12.9*  HCT 39.8 38.4*  MCV 91.9 90.1  PLT 199 210    Basename 10/20/11 1600 10/20/11 0500 10/20/11 0046  CKTOTAL 57 60 64  CKMB 3.2 3.3 3.4  CKMBINDEX -- -- --  TROPONINI <0.30 <0.30 <0.30   No components found with this basename: POCBNP:3 No results found for this basename: DDIMER:2 in the last 72 hours No results found for this basename: HGBA1C:2 in the last 72 hours  Basename 10/21/11 0731  CHOL 134  HDL 30*  LDLCALC 67  TRIG 454*  CHOLHDL 4.5  LDLDIRECT --   No results found for this basename:  TSH,T4TOTAL,FREET3,T3FREE,THYROIDAB in the last 72 hours No results found for this basename: VITAMINB12:2,FOLATE:2,FERRITIN:2,TIBC:2,IRON:2,RETICCTPCT:2 in the last 72 hours  Micro Results: No results found for this or any previous visit (from the past 240 hour(s)).  Studies/Results: No results found.  Medications:     . aspirin EC  325 mg Oral Daily  . chlorproMAZINE  10 mg Oral TID  . clopidogrel  75 mg Oral Q breakfast  . enoxaparin (LOVENOX) injection  70 mg Subcutaneous BID  . fenofibrate  160 mg Oral Daily  . metoprolol tartrate  12.5 mg Oral BID  . nitroGLYCERIN  0.2 mg Transdermal Daily  . pantoprazole  40 mg Oral Q1200  . prazosin  1 mg Oral QHS  . DISCONTD: chlorproMAZINE  25 mg Oral TID    Assessment: Principal Problem:  *Chest pain Active Problems:  CAD (coronary artery disease)  Hyperlipidemia  Kidney calculus  Depression  Stroke  Asthma  Unstable angina  Post traumatic stress disorder (PTSD)  Anxiety   Plan: #1 chest pain/[place] Unknown etiology. Patient states some improvement with jaw pain. No CP this AM. Patient does have a history of coronary artery disease status post stents x2. Patient states that did have recent kidney stone removal and Plavix was held and recently resumed  2 days prior to admission. Patient is currently on a nitroglycerin patch. Cardiac enzymes are negative x 3. 2-D echo pending. Fasting lipid panel with LDL 67, TG 186. Tricor. Continue oxygen, aspirin, Plavix, beta blocker. myoview stress test today. Per cardiology. #2 coronary artery disease status post stent times 2. See #1. #3 hyperlipidemia LDL is 67. TG 186. Start tricor. #4 depression Stable. #5 history of stroke No residual deficits. Continue risk factor modification. Continue aspirin. #6 PTSD/Anxiety Continue minipress for PTSD per psych rxcs and thorazine for anxiety. Will need outpatient f/u at [facility].Psych ff and appreciate input and rxcs. #7 prophylaxis Lovenox for DVT  prophylaxis. Protonix for GI prophylaxis.   LOS: 3 days   Roshell Brigham 10/22/2011, 7:39 AM

## 2011-10-23 DIAGNOSIS — F4310 Post-traumatic stress disorder, unspecified: Secondary | ICD-10-CM

## 2011-10-23 DIAGNOSIS — I959 Hypotension, unspecified: Secondary | ICD-10-CM

## 2011-10-23 DIAGNOSIS — F329 Major depressive disorder, single episode, unspecified: Secondary | ICD-10-CM

## 2011-10-23 LAB — BASIC METABOLIC PANEL
Calcium: 9.2 mg/dL (ref 8.4–10.5)
Creatinine, Ser: 0.96 mg/dL (ref 0.50–1.35)
GFR calc Af Amer: >90 mL/min (ref >90)
GFR calc non Af Amer: 83 mL/min — ABNORMAL LOW (ref >90)
Sodium: 135 mEq/L (ref 135–145)

## 2011-10-23 MED ORDER — CARVEDILOL 3.125 MG PO TABS
3.1250 mg | ORAL_TABLET | Freq: Two times a day (BID) | ORAL | Status: DC
  Administered 2011-10-23 – 2011-10-24 (×3): 3.125 mg via ORAL
  Filled 2011-10-23 (×5): qty 1

## 2011-10-23 MED ORDER — RAMIPRIL 2.5 MG PO CAPS
2.5000 mg | ORAL_CAPSULE | Freq: Every day | ORAL | Status: DC
  Administered 2011-10-23 – 2011-10-24 (×2): 2.5 mg via ORAL
  Filled 2011-10-23 (×2): qty 1

## 2011-10-23 NOTE — Progress Notes (Signed)
Pt is seen late afternoon 10/22/11.  He explains Super Bowl is a 'trigger' for suicidal ideation-passive thoughts and bereavement for all the buddies who where Vets and met for the Super Bowl every year. He did not want to watch it this year.  Today he says he feels better.  He is very focused on psychosocial issues surrounding his wife, 70 yo daughter, and in-laws.   This patient will be followed tomorrow 10/23/11.  Mickeal Skinner MD Psychiatrist

## 2011-10-23 NOTE — Progress Notes (Signed)
Pt sitting up in chair when am vitals checked with bp 82/44 by Nurse Technician.  Pt stated he was "a little dizzy".  Pt assisted to bed.  Lying bp manually was 88/48.  MD paged. Westley Chandler, Charity fundraiser.

## 2011-10-23 NOTE — Progress Notes (Signed)
ANTICOAGULATION CONSULT NOTE - Follow Up Consult  Pharmacy Consult for lovenox Indication: CP, r/o ACS  No Known Allergies  Patient Measurements: Height: 6' (182.9 cm) Weight: 153 lb (69.4 kg) IBW/kg (Calculated) : 77.6   Vital Signs: Temp: 98 F (36.7 C) (02/05 0800) Temp src: Oral (02/05 0800) BP: 109/72 mmHg (02/05 1025) Pulse Rate: 78  (02/05 1025)  Labs:  Basename 10/23/11 0502 10/22/11 0656 10/21/11 0731 10/20/11 1600  HGB -- -- 13.2 --  HCT -- -- 39.8 --  PLT -- -- 199 --  APTT -- -- -- --  LABPROT -- -- -- --  INR -- -- -- --  HEPARINUNFRC -- -- -- --  CREATININE 0.96 0.91 0.95 --  CKTOTAL -- -- -- 57  CKMB -- -- -- 3.2  TROPONINI -- -- -- <0.30   Estimated Creatinine Clearance: 71.3 ml/min (by C-G formula based on Cr of 0.96).   Medications:  Scheduled:    . aspirin EC  325 mg Oral Daily  . carvedilol  3.125 mg Oral BID WC  . chlorproMAZINE  10 mg Oral TID  . clopidogrel  75 mg Oral Q breakfast  . enoxaparin (LOVENOX) injection  70 mg Subcutaneous BID  . fenofibrate  160 mg Oral Daily  . nicotine  21 mg Transdermal Daily  . nitroGLYCERIN  0.2 mg Transdermal Daily  . pantoprazole  40 mg Oral Q1200  . prazosin  1 mg Oral QHS  . ramipril  2.5 mg Oral Daily  . DISCONTD: metoprolol tartrate  12.5 mg Oral BID    Assessment: 71 yo male here with CP on lovenox and noted with negative cardiac enzymes and nuclear stress test negative on 10/22/11.    Plan:  -Consider d/c lovenox    Neil Bass 10/23/2011,10:36 AM
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
## 2011-10-23 NOTE — Progress Notes (Signed)
Subjective: Patient with episode of hypotension this morning with SBP in 70s, with some dizziness. Patient states has some chest wall cramping earlier on today. Still with some jaw pain this morning. No SI/HI.  Objective: Vital signs in last 24 hours: Filed Vitals:   10/23/11 1025 10/23/11 1200 10/23/11 1259 10/23/11 1604  BP: 109/72 116/74 126/57 107/62  Pulse: 78 80 74 78  Temp:   97.7 F (36.5 C)   TempSrc:   Oral   Resp:   16   Height:      Weight:      SpO2:   98%     Intake/Output Summary (Last 24 hours) at 10/23/11 1707 Last data filed at 10/23/11 1100  Gross per 24 hour  Intake    960 ml  Output      0 ml  Net    960 ml    Weight change:   General: Alert, awake, oriented x3, in no acute distress. Heart: Regular rate and rhythm, without murmurs, rubs, gallops. Lungs: Clear to auscultation bilaterally. Abdomen: Soft, nontender, nondistended, positive bowel sounds. Extremities: No clubbing cyanosis or edema with positive pedal pulses.    Lab Results:  Basename 10/23/11 0502 10/22/11 0656  NA 135 138  K 4.0 4.1  CL 101 105  CO2 25 26  GLUCOSE 101* 101*  BUN 21 14  CREATININE 0.96 0.91  CALCIUM 9.2 9.4  MG -- --  PHOS -- --   No results found for this basename: AST:2,ALT:2,ALKPHOS:2,BILITOT:2,PROT:2,ALBUMIN:2 in the last 72 hours No results found for this basename: LIPASE:2,AMYLASE:2 in the last 72 hours  Basename 10/21/11 0731  WBC 8.2  NEUTROABS --  HGB 13.2  HCT 39.8  MCV 91.9  PLT 199   No results found for this basename: CKTOTAL:3,CKMB:3,CKMBINDEX:3,TROPONINI:3 in the last 72 hours No components found with this basename: POCBNP:3 No results found for this basename: DDIMER:2 in the last 72 hours No results found for this basename: HGBA1C:2 in the last 72 hours  Basename 10/21/11 0731  CHOL 134  HDL 30*  LDLCALC 67  TRIG 161*  CHOLHDL 4.5  LDLDIRECT --   No results found for this basename: TSH,T4TOTAL,FREET3,T3FREE,THYROIDAB in the last  72 hours No results found for this basename: VITAMINB12:2,FOLATE:2,FERRITIN:2,TIBC:2,IRON:2,RETICCTPCT:2 in the last 72 hours  Micro Results: No results found for this or any previous visit (from the past 240 hour(s)).  Studies/Results: Nm Myocar Multi W/spect W/wall Motion / Ef  10/22/2011  *RADIOLOGY REPORT*  Clinical Data:  Chest pain.  MYOCARDIAL IMAGING WITH SPECT (REST AND PHARMACOLOGIC-STRESS) GATED LEFT VENTRICULAR WALL MOTION STUDY LEFT VENTRICULAR EJECTION FRACTION  Technique:  Standard myocardial SPECT imaging was performed after resting intravenous injection of 10 mCi Tc-2m tetrofosmin. Subsequently, intravenous infusion of Lexiscan was performed under the supervision of the Cardiology staff.  At peak effect of the drug, 30 mCi Tc-17m tetrofosmin was injected intravenously and standard myocardial SPECT  imaging was performed.  Quantitative gated imaging was also performed to evaluate left ventricular wall motion, and estimate left ventricular ejection fraction.  Comparison:  None available.  Findings: The SPECT stress and rest images demonstrate a large inferior wall infarct which involves the apex and the inferior and apical portions of the septum and lateral walls.  No reversible defects to suggest ischemia.  The estimated ejection fraction was 27%.  IMPRESSION:  1.  Large inferoapical infarct. 2.  No findings for ischemia. 3.  Estimated ejection fraction is 27%.  Original Report Authenticated By: P. Loralie Champagne, M.D.  Medications:     . aspirin EC  325 mg Oral Daily  . carvedilol  3.125 mg Oral BID WC  . chlorproMAZINE  10 mg Oral TID  . clopidogrel  75 mg Oral Q breakfast  . enoxaparin (LOVENOX) injection  70 mg Subcutaneous BID  . fenofibrate  160 mg Oral Daily  . nicotine  21 mg Transdermal Daily  . nitroGLYCERIN  0.2 mg Transdermal Daily  . pantoprazole  40 mg Oral Q1200  . ramipril  2.5 mg Oral Daily  . DISCONTD: metoprolol tartrate  12.5 mg Oral BID  . DISCONTD:  prazosin  1 mg Oral QHS    Assessment: Principal Problem:  *Chest pain Active Problems:  CAD (coronary artery disease)  Hyperlipidemia  Kidney calculus  Depression  Stroke  Asthma  Unstable angina  Post traumatic stress disorder (PTSD)  Anxiety  Hypotension   Plan: #1 chest pain/[place] Unknown etiology. Patient states some improvement with jaw pain. Some cramping in chest wall this AM. Patient does have a history of coronary artery disease status post stents x2. Patient states that did have recent kidney stone removal and Plavix was held and recently resumed 2 days prior to admission. Patient is currently on a nitroglycerin patch. Cardiac enzymes are negative x 3. Fasting lipid panel with LDL 67, TG 186. Tricor. Continue oxygen, aspirin, Plavix, beta blocker. myoview stress test with large inferoapical infarct EF= 27% . Ace INHIB and beta blocker started per  Cardiology. #2. Hypotension Likely secondary to minipress. Improved throughout the day. Will d/c minipress. Follow closely with addition of coreg and lisinopril. #3 coronary artery disease status post stent times 2. See #1. #4 hyperlipidemia LDL is 67. TG 186. Continue tricor. #5 depression Stable. #6 history of stroke No residual deficits. Continue risk factor modification. Continue aspirin. #7 PTSD/Anxiety D/C minipress secondary to #2. Will need outpatient f/u at [facility].Psych ff and appreciate input and rxcs. Continue thorazine for anxiety. #8 prophylaxis Lovenox for DVT prophylaxis. Protonix for GI prophylaxis.   LOS: 4 days   THOMPSON,DANIEL 10/23/2011, 5:07 PM

## 2011-10-23 NOTE — Progress Notes (Signed)
Reviewed by Sonja Wilson EdD 

## 2011-10-23 NOTE — Progress Notes (Signed)
Subjective:  Patient denies any chest pain or shortness of breath Complains of jaw pain off and on Nuclear stress test negative for ischemia with large area of scarring EF of 27%  Objective:  Vital Signs in the last 24 hours: Temp:  [97.4 F (36.3 C)-98 F (36.7 C)] 98 F (36.7 C) (02/05 0800) Pulse Rate:  [64-108] 64  (02/05 0800) Resp:  [14-20] 14  (02/05 0800) BP: (87-155)/(44-95) 106/71 mmHg (02/05 0800) SpO2:  [96 %-100 %] 97 % (02/05 0800)  Intake/Output from previous day: 02/04 0701 - 02/05 0700 In: 480 [P.O.:480] Out: -  Intake/Output from this shift:    Physical Exam: General appearance: alert and cooperative Neck: no carotid bruit, no JVD and supple, symmetrical, trachea midline Lungs: clear to auscultation bilaterally Heart: regular rate and rhythm, S1, S2 normal and Soft systolic murmur noted no S3 or S4 gallop Abdomen: soft, non-tender; bowel sounds normal; no masses,  no organomegaly Extremities: extremities normal, atraumatic, no cyanosis or edema  Lab Results:  [facility] 10/21/11 0731  WBC 8.2  HGB 13.2  PLT 199    Basename 10/23/11 0502 10/22/11 0656  NA 135 138  K 4.0 4.1  CL 101 105  CO2 25 26  GLUCOSE 101* 101*  BUN 21 14  CREATININE 0.96 0.91    Basename 10/20/11 1600  TROPONINI <0.30   Hepatic Function Panel No results found for this basename: PROT,ALBUMIN,AST,ALT,ALKPHOS,BILITOT,BILIDIR,IBILI in the last 72 hours  Basename 10/21/11 0731  CHOL 134   No results found for this basename: PROTIME in the last 72 hours  Imaging: Nm Myocar Multi W/spect W/wall Motion / Ef  10/22/2011  *RADIOLOGY REPORT*  Clinical Data:  Chest pain.  MYOCARDIAL IMAGING WITH SPECT (REST AND PHARMACOLOGIC-STRESS) GATED LEFT VENTRICULAR WALL MOTION STUDY LEFT VENTRICULAR EJECTION FRACTION  Technique:  Standard myocardial SPECT imaging was performed after resting intravenous injection of 10 mCi Tc-33m tetrofosmin. Subsequently, intravenous infusion of Lexiscan was  performed under the supervision of the Cardiology staff.  At peak effect of the drug, 30 mCi Tc-5m tetrofosmin was injected intravenously and standard myocardial SPECT  imaging was performed.  Quantitative gated imaging was also performed to evaluate left ventricular wall motion, and estimate left ventricular ejection fraction.  Comparison:  None available.  Findings: The SPECT stress and rest images demonstrate a large inferior wall infarct which involves the apex and the inferior and apical portions of the septum and lateral walls.  No reversible defects to suggest ischemia.  The estimated ejection fraction was 27%.  IMPRESSION:  1.  Large inferoapical infarct. 2.  No findings for ischemia. 3.  Estimated ejection fraction is 27%.  Original Report Authenticated By: P. Loralie Champagne, M.D.    Cardiac Studies:  Assessment/Plan:  Status post chest pain MI ruled out Coronary artery disease history of MI x2 Ischemic cardiomyopathy Hypertension History of CVA Hypercholesteremia Depression COPD Posttraumatic stress disorder History of renal stones Plan Add low-dose ACE inhibitors as blood pressure tolerates Change metoprolol tartrate 2 carvedilol per orders Okay to discharge from cardiac point of few followup with me in one to 2 weeks  LOS: 4 days    Tiffanee Mcnee N 10/23/2011, 8:54 AM

## 2011-10-24 LAB — BASIC METABOLIC PANEL
CO2: 27 mEq/L (ref 19–32)
Calcium: 9.9 mg/dL (ref 8.4–10.5)
Creatinine, Ser: 1.02 mg/dL (ref 0.50–1.35)
GFR calc Af Amer: 85 mL/min — ABNORMAL LOW (ref >90)
GFR calc non Af Amer: 73 mL/min — ABNORMAL LOW (ref >90)
Sodium: 139 mEq/L (ref 135–145)

## 2011-10-24 MED ORDER — ASPIRIN 325 MG PO TBEC: 325.0000 mg | DELAYED_RELEASE_TABLET | Freq: Every day | ORAL | Status: AC

## 2011-10-24 MED ORDER — CARVEDILOL 3.125 MG PO TABS: 3.1250 mg | ORAL_TABLET | Freq: Two times a day (BID) | ORAL | Status: DC

## 2011-10-24 MED ORDER — RAMIPRIL 2.5 MG PO CAPS: 2.5000 mg | ORAL_CAPSULE | Freq: Every day | ORAL | Status: DC

## 2011-10-24 MED ORDER — FENOFIBRATE 160 MG PO TABS: 160.0000 mg | ORAL_TABLET | Freq: Every day | ORAL | Status: DC

## 2011-10-24 MED ORDER — OXYCODONE HCL 5 MG PO TABS: 5.0000 mg | ORAL_TABLET | ORAL | Status: AC | PRN

## 2011-10-24 MED ORDER — CHLORPROMAZINE HCL 10 MG PO TABS: 10.0000 mg | ORAL_TABLET | Freq: Three times a day (TID) | ORAL | Status: DC

## 2011-10-24 NOTE — Progress Notes (Signed)
   CARE MANAGEMENT NOTE 10/24/2011  Patient:  Neil Bass, Neil Bass   Account Number:  [id number]  Date Initiated:  10/24/2011  Documentation initiated by:  Letha Cape  Subjective/Objective Assessment:   dx chest pain  observation     Action/Plan:   Anticipated DC Date:  10/24/2011   Anticipated DC Plan:  HOME/SELF CARE      DC Planning Services  CM consult      Choice offered to / List presented to:             Status of service:  Completed, signed off Medicare Important Message given?   (If response is "NO", the following Medicare IM given date fields will be blank) Date Medicare IM given:   Date Additional Medicare IM given:    Discharge Disposition:  HOME/SELF CARE  Per UR Regulation:    Comments:  10/24/11 16:43 Letha Cape RN, BSN [id number] patient for dc today, no needs idenditified.

## 2011-10-24 NOTE — Progress Notes (Signed)
CSW was consulted to assist his Pt in transporting home.  Pt was provided with a [place] pass, funds for a [employer] pass, a map of the [employer] location the bus will drop him off and it's proximity to his house (approx. 1 mile), and instructions for transferring at [facility] hub.  CSW will sign off at this time.

## 2011-10-24 NOTE — Progress Notes (Signed)
Utilization review completed.  

## 2011-10-24 NOTE — Progress Notes (Signed)
Subjective:  Patient denies any chest pain or shortness of breath No further episodes of hypotension off Minipress Tolerating ACE inhibitors and beta blockers blood pressure remained stable  Objective:  Vital Signs in the last 24 hours: Temp:  [97.2 F (36.2 C)-98.4 F (36.9 C)] 98.1 F (36.7 C) (02/06 0719) Pulse Rate:  [66-94] 94  (02/06 1119) Resp:  [16-20] 18  (02/06 0719) BP: (95-131)/(45-76) 110/76 mmHg (02/06 1119) SpO2:  [95 %-99 %] 99 % (02/06 0719)  Intake/Output from previous day: 02/05 0701 - 02/06 0700 In: 1200 [P.O.:1200] Out: -  Intake/Output from this shift: Total I/O In: -  Out: 1725 [Urine:1725]  Physical Exam: General appearance: alert and cooperative Neck: no adenopathy, no carotid bruit, no JVD and supple, symmetrical, trachea midline Lungs: clear to auscultation bilaterally Heart: regular rate and rhythm, S1, S2 normal and Soft systolic murmur no S3 gallop Abdomen: soft, non-tender; bowel sounds normal; no masses,  no organomegaly Extremities: extremities normal, atraumatic, no cyanosis or edema  Lab Results: No results found for this basename: WBC:2,HGB:2,PLT:2 in the last 72 hours  Basename 10/24/11 0508 10/23/11 0502  NA 139 135  K 4.1 4.0  CL 103 101  CO2 27 25  GLUCOSE 95 101*  BUN 18 21  CREATININE 1.02 0.96   No results found for this basename: TROPONINI:2,CK,MB:2 in the last 72 hours Hepatic Function Panel No results found for this basename: PROT,ALBUMIN,AST,ALT,ALKPHOS,BILITOT,BILIDIR,IBILI in the last 72 hours No results found for this basename: CHOL in the last 72 hours No results found for this basename: PROTIME in the last 72 hours  Imaging: No results found.  Cardiac Studies:  Assessment/Plan:  Status post chest pain MI ruled out Coronary artery disease history of MI in past x2 Ischemic cardiomyopathy Compensated systolic heart failure History of CVA Hypercholesteremia Depression COPD History of renal  stones Posttraumatic stress disorder Plan Agree with discharge on beta blockers and ACE inhibitors Followup with me in one week Will up titrate beta blockers and ACE inhibitors as tolerated Will need to repeat 2-D echo in few months to evaluate LV systolic function may need ICD if LV function remains ejection fraction of 35%  LOS: 5 days    Aparna Vanderweele N 10/24/2011, 12:35 PM

## 2011-10-24 NOTE — Discharge Summary (Signed)
Admit date: 10/19/2011 Discharge date: 10/24/2011  Primary Care Physician:  Sherrie Mustache   Discharge Diagnoses:   Hypotension: Resolved off of minipress  Active Hospital Problems  Diagnoses Date Noted   . Chest pain 10/19/2011   . Hypotension 10/23/2011   . Unstable angina 10/21/2011   . Post traumatic stress disorder (PTSD) 10/21/2011   . Anxiety 10/21/2011   . CAD (coronary artery disease) 10/20/2011   . Hyperlipidemia 10/20/2011   . Kidney calculus 10/20/2011   . Depression 10/20/2011   . Stroke 10/20/2011   . Asthma 10/20/2011     Resolved Hospital Problems  Diagnoses Date Noted Date Resolved     DISCHARGE MEDICATION: Medication List  As of 10/24/2011 10:48 AM   ASK your doctor about these medications         carvedilol 6.25 MG tablet   Commonly known as: COREG   Take 3.125 mg by mouth 2 (two) times daily with a meal.      clopidogrel 75 MG tablet   Commonly known as: PLAVIX   Take 75 mg by mouth daily.      lisinopril 10 MG tablet   Commonly known as: PRINIVIL,ZESTRIL   Take 5 mg by mouth daily.      nitroGLYCERIN 0.4 MG SL tablet   Commonly known as: NITROSTAT   Place 0.4 mg under the tongue every 5 (five) minutes as needed. For chest pain      oxycodone 30 MG immediate release tablet   Commonly known as: ROXICODONE   Take 15 mg by mouth 4 (four) times daily.              Consults:     SIGNIFICANT DIAGNOSTIC STUDIES:  Dg Chest 2 View  10/19/2011  *RADIOLOGY REPORT*  Clinical Data: Left-sided chest pain.  CHEST - 2 VIEW  Comparison:  07/19/2004  Findings:  The heart size and mediastinal contours are within normal limits.  Both lungs are clear.  Old distal left clavicle fracture deformity again noted.  IMPRESSION: Stable exam.  No active cardiopulmonary disease.  Original Report Authenticated By: Danae Orleans, M.D.   Nm Myocar Multi W/spect W/wall Motion / Ef  10/22/2011  *RADIOLOGY REPORT*  Clinical Data:  Chest pain.  MYOCARDIAL IMAGING WITH SPECT  (REST AND PHARMACOLOGIC-STRESS) GATED LEFT VENTRICULAR WALL MOTION STUDY LEFT VENTRICULAR EJECTION FRACTION  Technique:  Standard myocardial SPECT imaging was performed after resting intravenous injection of 10 mCi Tc-49m tetrofosmin. Subsequently, intravenous infusion of Lexiscan was performed under the supervision of the Cardiology staff.  At peak effect of the drug, 30 mCi Tc-26m tetrofosmin was injected intravenously and standard myocardial SPECT  imaging was performed.  Quantitative gated imaging was also performed to evaluate left ventricular wall motion, and estimate left ventricular ejection fraction.  Comparison:  None available.  Findings: The SPECT stress and rest images demonstrate a large inferior wall infarct which involves the apex and the inferior and apical portions of the septum and lateral walls.  No reversible defects to suggest ischemia.  The estimated ejection fraction was 27%.  IMPRESSION:  1.  Large inferoapical infarct. 2.  No findings for ischemia. 3.  Estimated ejection fraction is 27%.  Original Report Authenticated By: P. Loralie Champagne, M.D.     ECHO:Study Conclusions  - Left ventricle: The cavity size was normal. Wall thickness was increased in a pattern of mild LVH. Systolic function was moderately to severely reduced. The estimated ejection fraction was in the range of 30% to 35%. Diffuse hypokinesis.  There is akinesis of the inferoposterior myocardium. Doppler parameters are consistent with abnormal left ventricular relaxation (grade 1 diastolic dysfunction). - Mitral valve: Mild regurgitation. - Pulmonary arteries: Systolic pressure was mildly increased. PA peak pressure: 36mm Hg (S). Transthoracic echocardiography. M-mode, complete 2D, spectral Doppler, and color Doppler. Height: Height: 182.9cm. Height: 72in. Weight: Weight: 69kg. Weight: 151.8lb. Body mass index: BMI: 20.6kg/m^2. Body surface area: BSA: 1.36m^2. Blood pressure: 137/77. Patient status:  Inpatient. Location: Bedside.       CARDIAC CATH & OTHER PROCEDURES: Pt also had NM Myocar Multi W/Spect W/Wall motion which impression showed: Also mentioned that there was no reversible defects to suggest ischemia. 1. Large inferoapical infarct. 2. No findings for ischemia. 3. Estimated ejection fraction is 27%.    No results found for this or any previous visit (from the past 240 hour(s)).  BRIEF ADMITTING H & P: Pt is a 71 y/o CM with h/o PTSD, Anxiety, CAD s/p stent x 2, who initially presented to the ED with chest discomfort.  Work-up was negative for new MI and patient was placed on B blocker and Ace I.  He became hypotensive before being placed on the low dose B blocker and ACE I and it was thought that it was secondary to being on the Minipress which had recently been added reportedly secondary to his h/o PTSD by his psychiatrist. Subsequently it was discontinued and patient was placed on B blocker and Ace I and managed to maintain normotensive on this regimen.  It was thought that patient had suicidal ideation while here and thus Psychiatry was consulted.  Patient was able to be cleared from psychiatries point of view and today denies any thoughts of harm to self or others.  He has already set up an appointment to see his psychiatrist on the 12th at the [place] [facility] reportedly.  He is also to f/u with cardiology in 1-2 weeks.  No resolved problems to display.  Active Hospital Problems  Diagnoses Date Noted   . Chest pain 10/19/2011   . Hypotension 10/23/2011   . Unstable angina 10/21/2011   . Post traumatic stress disorder (PTSD) 10/21/2011   . Anxiety 10/21/2011   . CAD (coronary artery disease) 10/20/2011   . Hyperlipidemia 10/20/2011   . Kidney calculus 10/20/2011   . Depression 10/20/2011   . Stroke 10/20/2011   . Asthma 10/20/2011     Resolved Hospital Problems  Diagnoses Date Noted Date Resolved     Disposition and Follow-up: f/u with Cardiology in 1-2  weeks specifically with Dr. Rinaldo Cloud and with his psychiatrist on the 12 th of this month.     DISCHARGE EXAM:  General: Alert, awake, oriented x3, in no acute distress. HEENT: Atraumatic, normocephalic, EOMI, ears and nose with normal appearance externally, No bruits, no goiter. Heart: Regular rate and rhythm, without murmurs, rubs, gallops. Lungs: Clear to auscultation bilaterally. Abdomen: Soft, nontender, nondistended, positive bowel sounds. Extremities: No clubbing cyanosis or edema with positive pedal pulses. Neuro: Grossly intact, nonfocal. Psych: mood and affect are appropriate.  No suicidal ideation currently    Blood pressure 128/74, pulse 74, temperature 98.1 F (36.7 C), temperature source Oral, resp. rate 18, height 6' (1.829 m), weight 69.4 kg (153 lb), SpO2 99.00%.   Basename 10/24/11 0508 10/23/11 0502  NA 139 135  K 4.1 4.0  CL 103 101  CO2 27 25  GLUCOSE 95 101*  BUN 18 21  CREATININE 1.02 0.96  CALCIUM 9.9 9.2  MG -- --  PHOS -- --   No results found for this basename: AST:2,ALT:2,ALKPHOS:2,BILITOT:2,PROT:2,ALBUMIN:2 in the last 72 hours No results found for this basename: LIPASE:2,AMYLASE:2 in the last 72 hours No results found for this basename: WBC:2,NEUTROABS:2,HGB:2,HCT:2,MCV:2,PLT:2 in the last 72 hours  Signed: Penny Pia M.D. 10/24/2011, 10:48 AM

## 2011-10-24 NOTE — Progress Notes (Signed)
Pt was provided with discharge instructions and education. I discussed the importance of following up with psychiatrist on 2/12 and following up with cardiologist in 1 week. Pt verbalized understanding and stated "i want to get better." Pt was able to reiterate what I told him. BP taken prior to discharge is 110/68. No distress at this time. Pt has seen social work and will be taking the bus from hospital to home. Pt is eating lunch prior to leaving. Instructed to call with further questions. Will continue to monitor until pt leaves unit. Ramond Craver, RN

## 2011-10-24 NOTE — Progress Notes (Signed)
Clinical Social Work has reviewed Psych MD recommendations and assessment and patient plan is to DC home to wife.  Patient can follow up at the [facility] if chooses for [facility].  [facility] if patient desires.  No other needs at this time.  Ashley Jacobs, MSW LCSW [phone]

## 2012-12-12 ENCOUNTER — Other Ambulatory Visit: Payer: Self-pay | Admitting: *Deleted

## 2012-12-12 DIAGNOSIS — Z01810 Encounter for preprocedural cardiovascular examination: Secondary | ICD-10-CM

## 2012-12-17 ENCOUNTER — Ambulatory Visit (HOSPITAL_COMMUNITY)
Admission: RE | Admit: 2012-12-17 | Discharge: 2012-12-17 | Disposition: A | Discharge status: Home / Self Care | Payer: Medicare Other | Source: Ambulatory Visit | Attending: Cardiovascular Disease | Admitting: Cardiovascular Disease

## 2012-12-17 ENCOUNTER — Encounter (HOSPITAL_COMMUNITY)
Admission: RE | Disposition: A | Discharge status: Home / Self Care | Payer: Self-pay | Source: Ambulatory Visit | Attending: Cardiovascular Disease

## 2012-12-17 ENCOUNTER — Encounter (HOSPITAL_COMMUNITY): Payer: Self-pay | Admitting: Cardiology

## 2012-12-17 DIAGNOSIS — Z7251 High risk heterosexual behavior: Secondary | ICD-10-CM

## 2012-12-17 DIAGNOSIS — F4310 Post-traumatic stress disorder, unspecified: Secondary | ICD-10-CM | POA: Diagnosis present

## 2012-12-17 DIAGNOSIS — Z01810 Encounter for preprocedural cardiovascular examination: Secondary | ICD-10-CM

## 2012-12-17 DIAGNOSIS — I10 Essential (primary) hypertension: Secondary | ICD-10-CM | POA: Insufficient documentation

## 2012-12-17 DIAGNOSIS — I2510 Atherosclerotic heart disease of native coronary artery without angina pectoris: Secondary | ICD-10-CM | POA: Insufficient documentation

## 2012-12-17 DIAGNOSIS — G8929 Other chronic pain: Secondary | ICD-10-CM | POA: Diagnosis present

## 2012-12-17 DIAGNOSIS — Z9861 Coronary angioplasty status: Secondary | ICD-10-CM | POA: Insufficient documentation

## 2012-12-17 DIAGNOSIS — R931 Abnormal findings on diagnostic imaging of heart and coronary circulation: Secondary | ICD-10-CM | POA: Diagnosis present

## 2012-12-17 DIAGNOSIS — F329 Major depressive disorder, single episode, unspecified: Secondary | ICD-10-CM | POA: Diagnosis present

## 2012-12-17 DIAGNOSIS — E785 Hyperlipidemia, unspecified: Secondary | ICD-10-CM | POA: Insufficient documentation

## 2012-12-17 DIAGNOSIS — M549 Dorsalgia, unspecified: Secondary | ICD-10-CM | POA: Diagnosis present

## 2012-12-17 DIAGNOSIS — I255 Ischemic cardiomyopathy: Secondary | ICD-10-CM | POA: Diagnosis present

## 2012-12-17 DIAGNOSIS — R079 Chest pain, unspecified: Secondary | ICD-10-CM | POA: Insufficient documentation

## 2012-12-17 DIAGNOSIS — I2589 Other forms of chronic ischemic heart disease: Secondary | ICD-10-CM | POA: Insufficient documentation

## 2012-12-17 DIAGNOSIS — F17200 Nicotine dependence, unspecified, uncomplicated: Secondary | ICD-10-CM | POA: Diagnosis present

## 2012-12-17 DIAGNOSIS — R9439 Abnormal result of other cardiovascular function study: Secondary | ICD-10-CM | POA: Insufficient documentation

## 2012-12-17 HISTORY — DX: Post-traumatic stress disorder, unspecified: F43.10

## 2012-12-17 HISTORY — PX: LEFT HEART CATHETERIZATION WITH CORONARY ANGIOGRAM: SHX5451

## 2012-12-17 LAB — CBC
MCH: 30.3 pg (ref 26.0–34.0)
MCHC: 33.8 g/dL (ref 30.0–36.0)
Platelets: 253 10*3/uL (ref 150–400)
RBC: 4.65 MIL/uL (ref 4.22–5.81)
RDW: 14.2 % (ref 11.5–15.5)

## 2012-12-17 LAB — BASIC METABOLIC PANEL
Calcium: 9.4 mg/dL (ref 8.4–10.5)
GFR calc non Af Amer: 65 mL/min — ABNORMAL LOW (ref >90)
Glucose, Bld: 98 mg/dL (ref 70–99)
Sodium: 139 mEq/L (ref 135–145)

## 2012-12-17 LAB — PROTIME-INR: Prothrombin Time: 12.7 seconds (ref 11.6–15.2)

## 2012-12-17 SURGERY — LEFT HEART CATHETERIZATION WITH CORONARY ANGIOGRAM
Anesthesia: LOCAL

## 2012-12-17 MED ORDER — NITROGLYCERIN 1 MG/10 ML FOR IR/CATH LAB
INTRA_ARTERIAL | Status: AC
  Filled 2012-12-17: qty 10

## 2012-12-17 MED ORDER — HEPARIN (PORCINE) IN NACL 2-0.9 UNIT/ML-% IJ SOLN
INTRAMUSCULAR | Status: AC
  Filled 2012-12-17: qty 1000

## 2012-12-17 MED ORDER — SODIUM CHLORIDE 0.9 % IV SOLN
INTRAVENOUS | Status: AC
  Administered 2012-12-17: 10:00:00 via INTRAVENOUS

## 2012-12-17 MED ORDER — FENTANYL CITRATE 0.05 MG/ML IJ SOLN
INTRAMUSCULAR | Status: AC
  Filled 2012-12-17: qty 2

## 2012-12-17 MED ORDER — ACETAMINOPHEN 325 MG PO TABS: 650.0000 mg | ORAL_TABLET | ORAL | Status: DC | PRN

## 2012-12-17 MED ORDER — SODIUM CHLORIDE 0.9 % IJ SOLN: 3.0000 mL | INTRAMUSCULAR | Status: DC | PRN

## 2012-12-17 MED ORDER — DIAZEPAM 5 MG PO TABS
5.0000 mg | ORAL_TABLET | ORAL | Status: AC
  Administered 2012-12-17: 5 mg via ORAL
  Filled 2012-12-17: qty 1

## 2012-12-17 MED ORDER — MIDAZOLAM HCL 2 MG/2ML IJ SOLN
INTRAMUSCULAR | Status: AC
  Filled 2012-12-17: qty 2

## 2012-12-17 MED ORDER — LIDOCAINE HCL (PF) 1 % IJ SOLN
INTRAMUSCULAR | Status: AC
  Filled 2012-12-17: qty 30

## 2012-12-17 MED ORDER — ONDANSETRON HCL 4 MG/2ML IJ SOLN: 4.0000 mg | Freq: Four times a day (QID) | INTRAMUSCULAR | Status: DC | PRN

## 2012-12-17 MED ORDER — SODIUM CHLORIDE 0.9 % IV SOLN
INTRAVENOUS | Status: DC
  Administered 2012-12-17: 07:00:00 via INTRAVENOUS

## 2012-12-17 MED ORDER — VERAPAMIL HCL 2.5 MG/ML IV SOLN
INTRAVENOUS | Status: AC
  Filled 2012-12-17: qty 2

## 2012-12-17 MED ORDER — HEPARIN SODIUM (PORCINE) 1000 UNIT/ML IJ SOLN
INTRAMUSCULAR | Status: AC
  Filled 2012-12-17: qty 1

## 2012-12-17 NOTE — H&P (Signed)
Patient ID: KIREE DEJARNETTE MRN: [id number], DOB/AGE: [birth year]   Admit date: 12/17/2012   Primary Physician: No primary provider on file. Primary Cardiologist: [facility]  HPI: 72 y/o [name] with a history of CAD, s/p PCI in [place] in 2009, and PCI in 2010 in [place]. No details available. He is followed at [facility]. He has recently had some chest pain and had an echo and Myoview. EF by echo was 45-50%. Myoview was abnormal with scar and peri infarct ischemia. He is here now for diagnostic cath.    Problem List: Past Medical History  Diagnosis Date  . Coronary artery disease   . Hypertension   . PTSD (post-traumatic stress disorder)     [name]  . Kidney stones 2013  . Hyperlipidemia   . Degenerative disk disease   . Asthma     uses albuterol every once in a while  . Stroke     no residual  . Depression     new onset r/t loss of friends    Past Surgical History  Procedure Laterality Date  . Coronary angioplasty with stent placement  2005, 2009     Allergies: No Known Allergies   Home Medications Prescriptions prior to admission  Medication Sig Dispense Refill  . carvedilol (COREG) 6.25 MG tablet Take 3.125 mg by mouth 2 (two) times daily with a meal.      . clopidogrel (PLAVIX) 75 MG tablet Take 75 mg by mouth daily.      Marland Kitchen lisinopril (PRINIVIL,ZESTRIL) 20 MG tablet Take 10 mg by mouth daily.      Marland Kitchen oxycodone (ROXICODONE) 30 MG immediate release tablet Take 15-30 mg by mouth every 6 (six) hours as needed for pain.      Marland Kitchen albuterol (PROVENTIL HFA;VENTOLIN HFA) 108 (90 BASE) MCG/ACT inhaler Inhale 2 puffs into the lungs every 6 (six) hours as needed for wheezing or shortness of breath.      . nitroGLYCERIN (NITROSTAT) 0.4 MG SL tablet Place 0.4 mg under the tongue every 5 (five) minutes as needed. For chest pain      . [DISCONTINUED] chlorproMAZINE (THORAZINE) 10 MG tablet Take 1 tablet (10 mg total) by mouth 3 (three) times daily.  54 tablet  0     No  family history on file.   History   Social History  . Marital Status: Married    Spouse Name: N/A    Number of Children: N/A  . Years of Education: N/A   Occupational History  . Not on file.   Social History Main Topics  . Smoking status: Current Every Day Smoker -- 1.00 packs/day for 50 years  . Smokeless tobacco: Not on file  . Alcohol Use: No  . Drug Use: No  . Sexually Active:    Other Topics Concern  . Not on file   Social History Narrative  . No narrative on file     Review of Systems: General: negative for chills, fever, night sweats or weight changes.  Cardiovascular: negative for chest pain, dyspnea on exertion, edema, orthopnea, palpitations, paroxysmal nocturnal dyspnea or shortness of breath Dermatological: negative for rash Respiratory: negative for cough or wheezing Urologic: negative for hematuria Abdominal: negative for nausea, vomiting, diarrhea, bright red blood per rectum, melena, or hematemesis Neurologic: negative for visual changes, syncope, or dizziness All other systems reviewed and are otherwise negative except as noted above.  Physical Exam: Blood pressure 143/82, pulse 77, temperature 97 F (36.1 C), temperature source Oral,  resp. rate 18, height 5' 11.5" (1.816 m), weight 69.4 kg (153 lb), SpO2 98.00%.  General appearance: alert, cooperative and no distress Neck: no carotid bruit and no JVD Lungs: clear to auscultation bilaterally Heart: regular rate and rhythm, S1, S2 normal, no murmur, click, rub or gallop Abdomen: soft, non-tender; bowel sounds normal; no masses,  no organomegaly Extremities: extremities normal, atraumatic, no cyanosis or edema Pulses: 2+ and symmetric Skin: Skin color, texture, turgor normal. No rashes or lesions Neurologic: Grossly normal    Labs:   Results for orders placed during the hospital encounter of 12/17/12 (from the past 24 hour(s))  BASIC METABOLIC PANEL     Status: Abnormal   Collection Time     12/17/12  6:57 AM      Result Value Range   Sodium 139  135 - 145 mEq/L   Potassium 4.1  3.5 - 5.1 mEq/L   Chloride 103  96 - 112 mEq/L   CO2 29  19 - 32 mEq/L   Glucose, Bld 98  70 - 99 mg/dL   BUN 15  6 - 23 mg/dL   Creatinine, Ser 1.61  0.50 - 1.35 mg/dL   Calcium 9.4  8.4 - 09.6 mg/dL   GFR calc non Af Amer 65 (*) >90 mL/min   GFR calc Af Amer 76 (*) >90 mL/min  CBC     Status: None   Collection Time    12/17/12  6:57 AM      Result Value Range   WBC 9.2  4.0 - 10.5 K/uL   RBC 4.65  4.22 - 5.81 MIL/uL   Hemoglobin 14.1  13.0 - 17.0 g/dL   HCT 04.5  40.9 - 81.1 %   MCV 89.7  78.0 - 100.0 fL   MCH 30.3  26.0 - 34.0 pg   MCHC 33.8  30.0 - 36.0 g/dL   RDW 91.4  78.2 - 95.6 %   Platelets 253  150 - 400 K/uL  PROTIME-INR     Status: None   Collection Time    12/17/12  6:57 AM      Result Value Range   Prothrombin Time 12.7  11.6 - 15.2 seconds   INR 0.96  0.00 - 1.49     Radiology/Studies: No results found.  EKG:NSR without acute changes.  ASSESSMENT AND PLAN:  Active Problems:   CAD (coronary artery disease)   Abnormal nuclear cardiac imaging test   Post traumatic stress disorder (PTSD)   Depression   Smoker- 3/4 pk day   Chronic back pain- pain contract at priamry care   Ischemic cardiomyopathy- EF 45-50% by Buck Mam 11/22/12   High risk sexual behavior- HIV screening at primary care  Plan- Cath today.  Deland Pretty, PA-C 12/17/2012, 8:55 AM Agree with note written by Corine Shelter PAC  CP, known CAD, + CRF and + MPI with mod LV dysfunction referred for cath.   Runell Gess 12/17/2012 9:13 AM

## 2012-12-17 NOTE — CV Procedure (Signed)
Neil Bass is a 72 y.o. male    [id number] LOCATION:  FACILITY: [facility]  PHYSICIAN: Nanetta Batty, M.D. May 15, 1941   DATE OF PROCEDURE:  12/17/2012  DATE OF DISCHARGE:  [facility]  CARDIAC CATHETERIZATION     History obtained from chart review.72 y/o [name] with a history of CAD, s/p PCI in [place] in [place], and PCI in 2010 in [place]. No details available. He is followed at [facility]. He has recently had some chest pain and had an echo and Myoview. EF by echo was 45-50%. Myoview was abnormal with scar and peri infarct ischemia. He is here now for diagnostic cath.    PROCEDURE DESCRIPTION:    The patient was brought to the second floor [place] Cardiac cath lab in the postabsorptive state. He was premedicated with Valium 5 mg by mouth, IV Versed and fentanyl. His right wrist was prepped and shaved in usual sterile fashion. Xylocaine 1% was used for local anesthesia. A 5 French sheath was inserted into the right radial artery using standard Seldinger technique. The patient received 5000 units  of heparin  intravenously.  A 5 [place] TIG catheter along with a 5 [place] JR 4 catheter and pigtail catheters were used for selective coronary angina free and left ventriculography respectively. Visipaque dye was used for the entirety of the case. Retrograde aortic, left ventricular and pullback pressures were recorded.    HEMODYNAMICS:    AO SYSTOLIC/AO DIASTOLIC: 134/70   LV SYSTOLIC/LV DIASTOLIC: 136/14  ANGIOGRAPHIC RESULTS:   1. Left main; normal  2. LAD; minor irregularities with the most 40-50% stenosis in the midportion after the takeoff of a moderate-sized diagonal branch 3. Left circumflex; nondominant and normal.  4. Right coronary artery; dominant with a widely patent proximal stent 5. Left ventriculography; RAO left ventriculogram was performed using  25 mL of Visipaque dye at 12 mL/second. The overall LVEF estimated  40 %  Without wall  motion abnormalities  IMPRESSION:[name] has a widely patent RCA stent which is the only stent visualized and otherwise minor irregularities with moderate LV dysfunction and an EF of 40% with global hypokinesia. The catheter wire were removed. The sheath was removed and a TR band  was placed on the right wrist which is patent hemostasis. The patient left the Cath Lab in stable condition. He will be hydrated and discharged home in 2 hours. He will followup with Dr. Andris Flurry. Medical therapy will be recommended.  Runell Gess MD, [facility] 12/17/2012 9:56 AM

## 2012-12-17 NOTE — H&P (Signed)
    Pt was reexamined and existing H & P reviewed. No changes found.  Runell Gess, MD [facility] 12/17/2012 9:23 AM

## 2014-08-26 ENCOUNTER — Encounter (HOSPITAL_COMMUNITY): Payer: Self-pay | Admitting: Cardiovascular Disease

## 2019-06-03 ENCOUNTER — Inpatient Hospital Stay (HOSPITAL_COMMUNITY)
Admission: EM | Admit: 2019-06-03 | Discharge: 2019-06-08 | DRG: 872 | Disposition: A | Discharge status: Skilled Nursing Facility | Payer: Medicare Other | Attending: Internal Medicine | Admitting: Internal Medicine

## 2019-06-03 ENCOUNTER — Emergency Department (HOSPITAL_COMMUNITY): Payer: Medicare Other

## 2019-06-03 ENCOUNTER — Other Ambulatory Visit: Payer: Self-pay

## 2019-06-03 DIAGNOSIS — I129 Hypertensive chronic kidney disease with stage 1 through stage 4 chronic kidney disease, or unspecified chronic kidney disease: Secondary | ICD-10-CM | POA: Diagnosis present

## 2019-06-03 DIAGNOSIS — N179 Acute kidney failure, unspecified: Secondary | ICD-10-CM | POA: Diagnosis present

## 2019-06-03 DIAGNOSIS — N390 Urinary tract infection, site not specified: Secondary | ICD-10-CM | POA: Diagnosis present

## 2019-06-03 DIAGNOSIS — N183 Chronic kidney disease, stage 3 (moderate): Secondary | ICD-10-CM | POA: Diagnosis present

## 2019-06-03 DIAGNOSIS — Z79899 Other long term (current) drug therapy: Secondary | ICD-10-CM

## 2019-06-03 DIAGNOSIS — F4310 Post-traumatic stress disorder, unspecified: Secondary | ICD-10-CM | POA: Diagnosis present

## 2019-06-03 DIAGNOSIS — Z7982 Long term (current) use of aspirin: Secondary | ICD-10-CM

## 2019-06-03 DIAGNOSIS — R195 Other fecal abnormalities: Secondary | ICD-10-CM | POA: Diagnosis present

## 2019-06-03 DIAGNOSIS — R319 Hematuria, unspecified: Secondary | ICD-10-CM | POA: Diagnosis present

## 2019-06-03 DIAGNOSIS — B9689 Other specified bacterial agents as the cause of diseases classified elsewhere: Secondary | ICD-10-CM | POA: Diagnosis present

## 2019-06-03 DIAGNOSIS — I2510 Atherosclerotic heart disease of native coronary artery without angina pectoris: Secondary | ICD-10-CM | POA: Diagnosis present

## 2019-06-03 DIAGNOSIS — M549 Dorsalgia, unspecified: Secondary | ICD-10-CM | POA: Diagnosis not present

## 2019-06-03 DIAGNOSIS — E785 Hyperlipidemia, unspecified: Secondary | ICD-10-CM | POA: Diagnosis present

## 2019-06-03 DIAGNOSIS — Z915 Personal history of self-harm: Secondary | ICD-10-CM

## 2019-06-03 DIAGNOSIS — F329 Major depressive disorder, single episode, unspecified: Secondary | ICD-10-CM | POA: Diagnosis present

## 2019-06-03 DIAGNOSIS — Z7951 Long term (current) use of inhaled steroids: Secondary | ICD-10-CM

## 2019-06-03 DIAGNOSIS — I252 Old myocardial infarction: Secondary | ICD-10-CM

## 2019-06-03 DIAGNOSIS — B961 Klebsiella pneumoniae [K. pneumoniae] as the cause of diseases classified elsewhere: Secondary | ICD-10-CM | POA: Diagnosis not present

## 2019-06-03 DIAGNOSIS — Z8679 Personal history of other diseases of the circulatory system: Secondary | ICD-10-CM | POA: Diagnosis not present

## 2019-06-03 DIAGNOSIS — M519 Unspecified thoracic, thoracolumbar and lumbosacral intervertebral disc disorder: Secondary | ICD-10-CM | POA: Diagnosis present

## 2019-06-03 DIAGNOSIS — Z8673 Personal history of transient ischemic attack (TIA), and cerebral infarction without residual deficits: Secondary | ICD-10-CM

## 2019-06-03 DIAGNOSIS — R079 Chest pain, unspecified: Secondary | ICD-10-CM | POA: Diagnosis not present

## 2019-06-03 DIAGNOSIS — A4189 Other specified sepsis: Principal | ICD-10-CM | POA: Diagnosis present

## 2019-06-03 DIAGNOSIS — I4510 Unspecified right bundle-branch block: Secondary | ICD-10-CM | POA: Diagnosis present

## 2019-06-03 DIAGNOSIS — I739 Peripheral vascular disease, unspecified: Secondary | ICD-10-CM | POA: Diagnosis present

## 2019-06-03 DIAGNOSIS — Z23 Encounter for immunization: Secondary | ICD-10-CM | POA: Diagnosis present

## 2019-06-03 DIAGNOSIS — E876 Hypokalemia: Secondary | ICD-10-CM | POA: Diagnosis not present

## 2019-06-03 DIAGNOSIS — R778 Other specified abnormalities of plasma proteins: Secondary | ICD-10-CM

## 2019-06-03 DIAGNOSIS — R197 Diarrhea, unspecified: Secondary | ICD-10-CM | POA: Diagnosis present

## 2019-06-03 DIAGNOSIS — I10 Essential (primary) hypertension: Secondary | ICD-10-CM | POA: Diagnosis not present

## 2019-06-03 DIAGNOSIS — E872 Acidosis: Secondary | ICD-10-CM | POA: Diagnosis not present

## 2019-06-03 DIAGNOSIS — Z20828 Contact with and (suspected) exposure to other viral communicable diseases: Secondary | ICD-10-CM | POA: Diagnosis present

## 2019-06-03 DIAGNOSIS — F1990 Other psychoactive substance use, unspecified, uncomplicated: Secondary | ICD-10-CM | POA: Diagnosis not present

## 2019-06-03 DIAGNOSIS — Z7902 Long term (current) use of antithrombotics/antiplatelets: Secondary | ICD-10-CM

## 2019-06-03 DIAGNOSIS — Z87442 Personal history of urinary calculi: Secondary | ICD-10-CM

## 2019-06-03 DIAGNOSIS — A419 Sepsis, unspecified organism: Secondary | ICD-10-CM | POA: Diagnosis present

## 2019-06-03 DIAGNOSIS — G8929 Other chronic pain: Secondary | ICD-10-CM | POA: Diagnosis not present

## 2019-06-03 DIAGNOSIS — Z955 Presence of coronary angioplasty implant and graft: Secondary | ICD-10-CM

## 2019-06-03 DIAGNOSIS — J449 Chronic obstructive pulmonary disease, unspecified: Secondary | ICD-10-CM | POA: Diagnosis present

## 2019-06-03 DIAGNOSIS — Z87891 Personal history of nicotine dependence: Secondary | ICD-10-CM

## 2019-06-03 DIAGNOSIS — I248 Other forms of acute ischemic heart disease: Secondary | ICD-10-CM | POA: Diagnosis present

## 2019-06-03 DIAGNOSIS — Z720 Tobacco use: Secondary | ICD-10-CM | POA: Diagnosis not present

## 2019-06-03 DIAGNOSIS — R6510 Systemic inflammatory response syndrome (SIRS) of non-infectious origin without acute organ dysfunction: Secondary | ICD-10-CM

## 2019-06-03 LAB — CK: Total CK: 104 U/L (ref 49–397)

## 2019-06-03 LAB — CBC WITH DIFFERENTIAL/PLATELET
Abs Immature Granulocytes: 0.15 10*3/uL — ABNORMAL HIGH (ref 0.00–0.07)
Basophils Absolute: 0.1 10*3/uL (ref 0.0–0.1)
Basophils Relative: 0 %
Eosinophils Absolute: 0 10*3/uL (ref 0.0–0.5)
Eosinophils Relative: 0 %
HCT: 47.3 % (ref 39.0–52.0)
Hemoglobin: 15.4 g/dL (ref 13.0–17.0)
Immature Granulocytes: 1 %
Lymphocytes Relative: 5 %
Lymphs Abs: 1 10*3/uL (ref 0.7–4.0)
MCH: 29.3 pg (ref 26.0–34.0)
MCHC: 32.6 g/dL (ref 30.0–36.0)
MCV: 89.9 fL (ref 80.0–100.0)
Monocytes Absolute: 2.3 10*3/uL — ABNORMAL HIGH (ref 0.1–1.0)
Monocytes Relative: 11 %
Neutro Abs: 17.5 10*3/uL — ABNORMAL HIGH (ref 1.7–7.7)
Neutrophils Relative %: 83 %
Platelets: 296 10*3/uL (ref 150–400)
RBC: 5.26 MIL/uL (ref 4.22–5.81)
RDW: 14.6 % (ref 11.5–15.5)
WBC: 20.9 10*3/uL — ABNORMAL HIGH (ref 4.0–10.5)
nRBC: 0 % (ref 0.0–0.2)

## 2019-06-03 LAB — URINALYSIS, ROUTINE W REFLEX MICROSCOPIC
Bilirubin Urine: NEGATIVE
Glucose, UA: NEGATIVE mg/dL
Ketones, ur: NEGATIVE mg/dL
Nitrite: POSITIVE — AB
Protein, ur: 30 mg/dL — AB
Specific Gravity, Urine: 1.025 (ref 1.005–1.030)
pH: 5 (ref 5.0–8.0)

## 2019-06-03 LAB — COMPREHENSIVE METABOLIC PANEL
ALT: 12 U/L (ref 0–44)
AST: 19 U/L (ref 15–41)
Albumin: 3.4 g/dL — ABNORMAL LOW (ref 3.5–5.0)
Alkaline Phosphatase: 114 U/L (ref 38–126)
Anion gap: 11 (ref 5–15)
BUN: 12 mg/dL (ref 8–23)
CO2: 23 mmol/L (ref 22–32)
Calcium: 8.7 mg/dL — ABNORMAL LOW (ref 8.9–10.3)
Chloride: 99 mmol/L (ref 98–111)
Creatinine, Ser: 1.55 mg/dL — ABNORMAL HIGH (ref 0.61–1.24)
GFR calc Af Amer: 50 mL/min — ABNORMAL LOW (ref >60)
GFR calc non Af Amer: 43 mL/min — ABNORMAL LOW (ref >60)
Glucose, Bld: 127 mg/dL — ABNORMAL HIGH (ref 70–99)
Potassium: 3.4 mmol/L — ABNORMAL LOW (ref 3.5–5.1)
Sodium: 133 mmol/L — ABNORMAL LOW (ref 135–145)
Total Bilirubin: 1.2 mg/dL (ref 0.3–1.2)
Total Protein: 7.2 g/dL (ref 6.5–8.1)

## 2019-06-03 LAB — I-STAT CREATININE, ED: Creatinine, Ser: 1.5 mg/dL — ABNORMAL HIGH (ref 0.61–1.24)

## 2019-06-03 LAB — TROPONIN I (HIGH SENSITIVITY)
Troponin I (High Sensitivity): 83 ng/L — ABNORMAL HIGH (ref <18)
Troponin I (High Sensitivity): 72 ng/L — ABNORMAL HIGH (ref <18)

## 2019-06-03 LAB — LACTIC ACID, PLASMA
Lactic Acid, Venous: 2 mmol/L (ref 0.5–1.9)
Lactic Acid, Venous: 2 mmol/L (ref 0.5–1.9)

## 2019-06-03 LAB — LIPASE, BLOOD: Lipase: 18 U/L (ref 11–51)

## 2019-06-03 MED ORDER — ASPIRIN EC 81 MG PO TBEC
81.0000 mg | DELAYED_RELEASE_TABLET | Freq: Every day | ORAL | Status: DC
  Administered 2019-06-04 – 2019-06-08 (×5): 81 mg via ORAL
  Filled 2019-06-03 (×5): qty 1

## 2019-06-03 MED ORDER — ACETAMINOPHEN 500 MG PO TABS: 1000.0000 mg | ORAL_TABLET | Freq: Once | ORAL | Status: DC

## 2019-06-03 MED ORDER — ONDANSETRON HCL 4 MG/2ML IJ SOLN
4.0000 mg | Freq: Once | INTRAMUSCULAR | Status: AC
  Administered 2019-06-03: 4 mg via INTRAVENOUS
  Filled 2019-06-03: qty 2

## 2019-06-03 MED ORDER — MOMETASONE FURO-FORMOTEROL FUM 100-5 MCG/ACT IN AERO
2.0000 | INHALATION_SPRAY | Freq: Two times a day (BID) | RESPIRATORY_TRACT | Status: DC
  Administered 2019-06-05 – 2019-06-08 (×7): 2 via RESPIRATORY_TRACT
  Filled 2019-06-03 (×2): qty 8.8

## 2019-06-03 MED ORDER — CLOPIDOGREL BISULFATE 75 MG PO TABS
75.0000 mg | ORAL_TABLET | Freq: Every day | ORAL | Status: DC
  Administered 2019-06-03 – 2019-06-08 (×6): 75 mg via ORAL
  Filled 2019-06-03 (×5): qty 1

## 2019-06-03 MED ORDER — IOHEXOL 350 MG/ML SOLN
80.0000 mL | Freq: Once | INTRAVENOUS | Status: AC | PRN
  Administered 2019-06-03: 80 mL via INTRAVENOUS

## 2019-06-03 MED ORDER — SODIUM CHLORIDE 0.9 % IV BOLUS: 1000.0000 mL | Freq: Once | INTRAVENOUS | Status: DC

## 2019-06-03 MED ORDER — ENOXAPARIN SODIUM 40 MG/0.4ML ~~LOC~~ SOLN
40.0000 mg | SUBCUTANEOUS | Status: DC
  Administered 2019-06-03 – 2019-06-08 (×6): 40 mg via SUBCUTANEOUS
  Filled 2019-06-03 (×6): qty 0.4

## 2019-06-03 MED ORDER — SODIUM CHLORIDE 0.9 % IV SOLN
1.0000 g | Freq: Once | INTRAVENOUS | Status: AC
  Administered 2019-06-03: 1 g via INTRAVENOUS
  Filled 2019-06-03: qty 10

## 2019-06-03 MED ORDER — ACETAMINOPHEN 650 MG RE SUPP: 650.0000 mg | Freq: Four times a day (QID) | RECTAL | Status: DC | PRN

## 2019-06-03 MED ORDER — ATORVASTATIN CALCIUM 40 MG PO TABS
40.0000 mg | ORAL_TABLET | Freq: Every day | ORAL | Status: DC
  Administered 2019-06-03 – 2019-06-08 (×6): 40 mg via ORAL
  Filled 2019-06-03 (×7): qty 1

## 2019-06-03 MED ORDER — SODIUM CHLORIDE 0.9 % IV BOLUS
1000.0000 mL | Freq: Once | INTRAVENOUS | Status: AC
  Administered 2019-06-03: 1000 mL via INTRAVENOUS

## 2019-06-03 MED ORDER — METOPROLOL SUCCINATE ER 25 MG PO TB24
12.5000 mg | ORAL_TABLET | Freq: Every day | ORAL | Status: DC
  Administered 2019-06-04 – 2019-06-08 (×5): 12.5 mg via ORAL
  Filled 2019-06-03 (×6): qty 1

## 2019-06-03 MED ORDER — ACETAMINOPHEN 325 MG PO TABS: 650.0000 mg | ORAL_TABLET | Freq: Four times a day (QID) | ORAL | Status: DC | PRN

## 2019-06-03 MED ORDER — ASPIRIN 81 MG PO CHEW
324.0000 mg | CHEWABLE_TABLET | Freq: Once | ORAL | Status: AC
  Administered 2019-06-03: 324 mg via ORAL
  Filled 2019-06-03: qty 4

## 2019-06-03 MED ORDER — SODIUM CHLORIDE 0.9% FLUSH
3.0000 mL | Freq: Two times a day (BID) | INTRAVENOUS | Status: DC
  Administered 2019-06-03 – 2019-06-08 (×9): 3 mL via INTRAVENOUS

## 2019-06-03 NOTE — ED Notes (Signed)
ED TO INPATIENT HANDOFF REPORT  ED Nurse Name and Phone #: Lorrin Goodell [phone]  S Name/Age/Gender Neil Bass 78 y.o. male Room/Bed: 039C/039C  Code Status   Code Status: Full Code  Home/SNF/Other Home Patient oriented to: self, place, time and situation Is this baseline? Yes   Triage Complete: Triage complete  Chief Complaint CP HX  OF AAA  Triage Note To room via EMS.  Onset several days chest pain and lower abd pain and swelling, and leg pain.   H/o AAA, per daughter last checked 4-5 years ago.   EMS BP 110/70 HR 90 Temp 98.1 SpO2 95%  Daughter Fady Stamps [phone]   Allergies No Known Allergies  Level of Care/Admitting Diagnosis ED Disposition    ED Disposition Condition [facility] Area: [place] [[id number]]  Level of Care: Telemetry Medical [104]  Covid Evaluation: Asymptomatic Screening Protocol (No Symptoms)  Diagnosis: Sepsis [facility]) [[id number]]  Admitting Physician: Aldine Contes [phone]  Attending Physician: Aldine Contes [phone]  Estimated length of stay: past midnight tomorrow  Certification:: I certify this patient will need inpatient services for at least 2 midnights  PT Class (Do Not Modify): Inpatient [101]  PT Acc Code (Do Not Modify): Private [1]       B Medical/Surgery History Past Medical History:  Diagnosis Date  . Asthma    uses albuterol every once in a while  . Coronary artery disease   . Degenerative disk disease   . Depression    new onset r/t loss of friends  . Hyperlipidemia   . Hypertension   . Kidney stones 2013  . PTSD (post-traumatic stress disorder)    [place]  . Stroke    no residual   Past Surgical History:  Procedure Laterality Date  . CORONARY ANGIOPLASTY WITH STENT PLACEMENT  2005, 2009  . LEFT HEART CATHETERIZATION WITH CORONARY ANGIOGRAM N/A 12/17/2012   Procedure: LEFT HEART CATHETERIZATION WITH CORONARY ANGIOGRAM;  Surgeon: Lorretta Harp, MD;  Location: [facility]  CATH LAB;  Service: Cardiovascular;  Laterality: N/A;     A IV Location/Drains/Wounds Patient Lines/Drains/Airways Status   Active Line/Drains/Airways    Name:   Placement date:   Placement time:   Site:   Days:   Peripheral IV 06/03/19 Right Antecubital   06/03/19    1400    Antecubital   less than 1          Intake/Output Last 24 hours  Intake/Output Summary (Last 24 hours) at 06/03/2019 2302 Last data filed at 06/03/2019 2253 Gross per 24 hour  Intake 1000 ml  Output -  Net 1000 ml    Labs/Imaging Results for orders placed or performed during the hospital encounter of 06/03/19 (from the past 48 hour(s))  CBC with Differential     Status: Abnormal   Collection Time: 06/03/19  1:01 PM  Result Value Ref Range   WBC 20.9 (H) 4.0 - 10.5 K/uL   RBC 5.26 4.22 - 5.81 MIL/uL   Hemoglobin 15.4 13.0 - 17.0 g/dL   HCT 47.3 39.0 - 52.0 %   MCV 89.9 80.0 - 100.0 fL   MCH 29.3 26.0 - 34.0 pg   MCHC 32.6 30.0 - 36.0 g/dL   RDW 14.6 11.5 - 15.5 %   Platelets 296 150 - 400 K/uL   nRBC 0.0 0.0 - 0.2 %   Neutrophils Relative % 83 %   Neutro Abs 17.5 (H) 1.7 - 7.7 K/uL   Lymphocytes Relative 5 %  Lymphs Abs 1.0 0.7 - 4.0 K/uL   Monocytes Relative 11 %   Monocytes Absolute 2.3 (H) 0.1 - 1.0 K/uL   Eosinophils Relative 0 %   Eosinophils Absolute 0.0 0.0 - 0.5 K/uL   Basophils Relative 0 %   Basophils Absolute 0.1 0.0 - 0.1 K/uL   Immature Granulocytes 1 %   Abs Immature Granulocytes 0.15 (H) 0.00 - 0.07 K/uL    Comment: Performed at [facility], [address] [place], [place] [address]  Comprehensive metabolic panel     Status: Abnormal   Collection Time: 06/03/19  1:01 PM  Result Value Ref Range   Sodium 133 (L) 135 - 145 mmol/L   Potassium 3.4 (L) 3.5 - 5.1 mmol/L   Chloride 99 98 - 111 mmol/L   CO2 23 22 - 32 mmol/L   Glucose, Bld 127 (H) 70 - 99 mg/dL   BUN 12 8 - 23 mg/dL   Creatinine, Ser 3.66 (H) 0.61 - 1.24 mg/dL   Calcium 8.7 (L) 8.9 - 10.3 mg/dL   Total  Protein 7.2 6.5 - 8.1 g/dL   Albumin 3.4 (L) 3.5 - 5.0 g/dL   AST 19 15 - 41 U/L   ALT 12 0 - 44 U/L   Alkaline Phosphatase 114 38 - 126 U/L   Total Bilirubin 1.2 0.3 - 1.2 mg/dL   GFR calc non Af Amer 43 (L) >60 mL/min   GFR calc Af Amer 50 (L) >60 mL/min   Anion gap 11 5 - 15    Comment: Performed at [facility], [address] [place], [place] [address]  Lipase, blood     Status: None   Collection Time: 06/03/19  1:01 PM  Result Value Ref Range   Lipase 18 11 - 51 U/L    Comment: Performed at [facility], [address], [place], [place] [address]  Troponin I (High Sensitivity)     Status: Abnormal   Collection Time: 06/03/19  1:01 PM  Result Value Ref Range   Troponin I (High Sensitivity) 83 (H) <18 ng/L    Comment: (NOTE) Elevated high sensitivity troponin I (hsTnI) values and significant  changes across serial measurements may suggest ACS but many other  chronic and acute conditions are known to elevate hsTnI results.  Refer to the "Links" section for chest pain algorithms and additional  guidance. Performed at [facility], [address] [place], [place] [address]   I-stat Creatinine, ED     Status: Abnormal   Collection Time: 06/03/19  1:09 PM  Result Value Ref Range   Creatinine, Ser 1.50 (H) 0.61 - 1.24 mg/dL  Lactic acid, plasma     Status: Abnormal   Collection Time: 06/03/19  2:20 PM  Result Value Ref Range   Lactic Acid, Venous 2.0 (HH) 0.5 - 1.9 mmol/L    Comment: CRITICAL RESULT CALLED TO, READ BACK BY AND VERIFIED WITH: E.PRYOR,RN 1516 06/03/2019 CLARK,S Performed at [facility], [address] [place], [place] [address]   Urinalysis, Routine w reflex microscopic     Status: Abnormal   Collection Time: 06/03/19  2:29 PM  Result Value Ref Range   Color, Urine YELLOW YELLOW   APPearance HAZY (A) CLEAR   Specific Gravity, Urine 1.025 1.005 - 1.030   pH 5.0 5.0 - 8.0   Glucose, UA NEGATIVE NEGATIVE mg/dL   Hgb urine dipstick  SMALL (A) NEGATIVE   Bilirubin Urine NEGATIVE NEGATIVE  Ketones, ur NEGATIVE NEGATIVE mg/dL   Protein, ur 30 (A) NEGATIVE mg/dL   Nitrite POSITIVE (A) NEGATIVE   Leukocytes,Ua SMALL (A) NEGATIVE   RBC / HPF 0-5 0 - 5 RBC/hpf   WBC, UA 11-20 0 - 5 WBC/hpf   Bacteria, UA FEW (A) NONE SEEN   Squamous Epithelial / LPF 0-5 0 - 5   Mucus PRESENT     Comment: Performed at [facility], [address] [place], [place] [address]  Troponin I (High Sensitivity)     Status: Abnormal   Collection Time: 06/03/19  4:17 PM  Result Value Ref Range   Troponin I (High Sensitivity) 72 (H) <18 ng/L    Comment: (NOTE) Elevated high sensitivity troponin I (hsTnI) values and significant  changes across serial measurements may suggest ACS but many other  chronic and acute conditions are known to elevate hsTnI results.  Refer to the "Links" section for chest pain algorithms and additional  guidance. Performed at [facility], [address] [place], [place] [address]   Lactic acid, plasma     Status: Abnormal   Collection Time: 06/03/19  4:33 PM  Result Value Ref Range   Lactic Acid, Venous 2.0 (HH) 0.5 - 1.9 mmol/L    Comment: CRITICAL VALUE NOTED.  VALUE IS CONSISTENT WITH PREVIOUSLY REPORTED AND CALLED VALUE. Performed at [facility], [address] [place], [place] [address]   CK     Status: None   Collection Time: 06/03/19  5:00 PM  Result Value Ref Range   Total CK 104 49 - 397 U/L    Comment: Performed at [facility], [address] [place], [place] [address]   Ct Angio Chest/abd/pel For Dissection W And/or Wo Contrast  Result Date: 06/03/2019 CLINICAL DATA:  Acute chest and lower abdominal pain. EXAM: CT ANGIOGRAPHY CHEST, ABDOMEN AND PELVIS TECHNIQUE: Multidetector CT imaging through the chest, abdomen and pelvis was performed using the standard protocol during bolus administration of intravenous contrast. Multiplanar reconstructed images and MIPs were obtained  and reviewed to evaluate the vascular anatomy. CONTRAST:  80mL OMNIPAQUE IOHEXOL 350 MG/ML SOLN COMPARISON:  CT scan of October 13, 2017. FINDINGS: CTA CHEST FINDINGS Cardiovascular: Atherosclerosis of thoracic aorta is noted without aneurysm or dissection. Great vessels are widely patent. Normal cardiac size. No pericardial effusion. Coronary artery calcifications are noted. Mediastinum/Nodes: No enlarged mediastinal, hilar, or axillary lymph nodes. Thyroid gland, trachea, and esophagus demonstrate no significant findings. Lungs/Pleura: No pneumothorax or pleural effusion is noted. Mild emphysematous disease is noted in the upper lobes. Probable scarring is noted in both lung bases. Musculoskeletal: Probable old sternal fracture is noted. No acute osseous abnormality is noted. Review of the MIP images confirms the above findings. CTA ABDOMEN AND PELVIS FINDINGS VASCULAR Aorta: Status post surgical Pera of abdominal aortic aneurysm. The graft and its limbs are widely patent. Celiac: Moderate focal stenosis is noted at the origin secondary to eccentric calcified plaque. SMA: Patent without evidence of aneurysm, dissection, vasculitis or significant stenosis. Renals: Both renal arteries are patent without evidence of aneurysm, dissection, vasculitis, fibromuscular dysplasia or significant stenosis. IMA: Patent without evidence of aneurysm, dissection, vasculitis or significant stenosis. Inflow: Patent without evidence of aneurysm, dissection, vasculitis or significant stenosis. Veins: No obvious venous abnormality within the limitations of this arterial phase study. Review of the MIP images confirms the above findings. NON-VASCULAR Hepatobiliary: No focal liver abnormality is seen. No gallstones, gallbladder wall thickening, or biliary dilatation. Pancreas:  Unremarkable. No pancreatic ductal dilatation or surrounding inflammatory changes. Spleen: Normal in size without focal abnormality. Adrenals/Urinary Tract:  Adrenal glands are unremarkable. Kidneys are normal, without renal calculi, focal lesion, or hydronephrosis. Bladder is unremarkable. 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 Appendix appears normal. No evidence of bowel wall thickening, distention, or inflammatory changes. Lymphatic: No significant adenopathy is noted. Reproductive: Prostate is unremarkable. Other: No abdominal wall hernia or abnormality. No abdominopelvic ascites. Musculoskeletal: No acute or significant osseous findings. Review of the MIP images confirms the above findings. IMPRESSION: No evidence of thoracic or abdominal aortic dissection or aneurysm. Status post surgical repair of infrarenal abdominal aortic aneurysm. The graft and its limbs are widely patent. 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 Coronary artery calcifications are noted suggesting coronary artery disease. Probable old sternal fracture is noted. 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 Aortic Atherosclerosis (ICD10-I70.0) and Emphysema (ICD10-J43.9). Electronically Signed   By: Lupita RaiderJames  Green Jr M.D.   On: 06/03/2019 14:24    Pending Labs Unresulted Labs (From admission, onward)    Start     Ordered   06/04/19 0500  CBC  Tomorrow morning,   R     06/03/19 1640   06/04/19 0500  Basic metabolic panel  Tomorrow morning,   R     06/03/19 1640   06/03/19 1801  C difficile quick scan w PCR reflex  (C Difficile quick screen w PCR reflex panel)  Once, for 24 hours,   STAT     06/03/19 1800   06/03/19 1517  SARS CORONAVIRUS 2 (TAT 6-24 HRS) Nasopharyngeal Nasopharyngeal Swab  (Asymptomatic/Tier 2 Patients Labs)  Once,   STAT    Question Answer Comment  Is this test for diagnosis or screening Screening   Symptomatic for COVID-19 as defined by CDC No    Hospitalized for COVID-19 No   Admitted to ICU for COVID-19 No   Previously tested for COVID-19 No   Resident in a congregate (group) care setting No   Employed in healthcare setting No      06/03/19 1516   06/03/19 1348  Urine culture  ONCE - STAT,   STAT     06/03/19 1347   06/03/19 1347  Culture, blood (routine x 2)  BLOOD CULTURE X 2,   STAT     06/03/19 1346          Vitals/Pain Today's Vitals   06/03/19 2030 06/03/19 2100 06/03/19 2130 06/03/19 2250  BP: (!) 101/57 117/70 102/67 (!) 105/58  Pulse:    (!) 106  Resp:      Temp:      TempSrc:      SpO2:    97%    Isolation Precautions Enteric precautions (UV disinfection)  Medications Medications  enoxaparin (LOVENOX) injection 40 mg (40 mg Subcutaneous Given 06/03/19 2015)  sodium chloride flush (NS) 0.9 % injection 3 mL (has no administration in time range)  acetaminophen (TYLENOL) tablet 650 mg (has no administration in time range)    Or  acetaminophen (TYLENOL) suppository 650 mg (has no administration in time range)  clopidogrel (PLAVIX) tablet 75 mg (75 mg Oral Given 06/03/19 2009)  aspirin EC tablet 81 mg (has no administration in time range)  atorvastatin (LIPITOR) tablet 40 mg (40 mg Oral Given 06/03/19 2009)  metoprolol succinate (TOPROL-XL) 24  hr tablet 12.5 mg (12.5 mg Oral Not Given 06/03/19 2012)  mometasone-formoterol (DULERA) 100-5 MCG/ACT inhaler 2 puff (2 puffs Inhalation Not Given 06/03/19 2014)  iohexol (OMNIPAQUE) 350 MG/ML injection 80 mL (80 mLs Intravenous Contrast Given 06/03/19 1316)  aspirin chewable tablet 324 mg (324 mg Oral Given 06/03/19 1430)  cefTRIAXone (ROCEPHIN) 1 g in sodium chloride 0.9 % 100 mL IVPB (0 g Intravenous Stopped 06/03/19 1656)  sodium chloride 0.9 % bolus 1,000 mL (0 mLs Intravenous Stopped 06/03/19 2253)  ondansetron (ZOFRAN) injection 4 mg (4 mg Intravenous Given 06/03/19 1746)    Mobility walks with device     Focused Assessments Cardiac Assessment Handoff:     Lab Results  Component Value Date   CKTOTAL 104 06/03/2019   CKMB 3.2 10/20/2011   TROPONINI <0.30 10/20/2011   No results found for: DDIMER Does the Patient currently have chest pain? No     R Recommendations: See Admitting Provider Note  Report given to:   Additional Notes:

## 2019-06-03 NOTE — ED Notes (Signed)
Report given to Ray, RN on 3E

## 2019-06-03 NOTE — ED Notes (Signed)
Sinus arrhythmia w/BBB on monitor

## 2019-06-03 NOTE — ED Notes (Signed)
Got patient into a gown on the monitor patient is resting with call bell in reach 

## 2019-06-03 NOTE — ED Provider Notes (Signed)
[facility] EMERGENCY [facility] Provider Note   CSN: [id number] Arrival date & time: 06/03/19  1240     History   Chief Complaint Chief Complaint  Patient presents with   Chest Pain   Abdominal Pain    HPI Neil Bass is a 78 y.o. male.     The history is provided by the patient and medical records. No language interpreter was used.  Chest Pain Associated symptoms: abdominal pain   Associated symptoms: no nausea and no vomiting   Abdominal Pain Associated symptoms: chest pain and diarrhea   Associated symptoms: no constipation, no nausea and no vomiting    Neil Bass is a 78 y.o. male  with a PMH as listed below including previous AAA s/p repair who presents to the Emergency Department complaining of overall not feeling well associated with chest pain, abdominal pain and lower extremity weakness.  Patient states that this began a week or so ago, but that the people around him at home thought he was going to die, therefore told him he needed to come to ER this morning. Denies shortness of breath. Denies known fever.  No nausea or vomiting.  No numbness.  Endorses diarrhea : 1 episode a day every 3 to 4 days.  Past Medical History:  Diagnosis Date   Asthma    uses albuterol every once in a while   Coronary artery disease    Degenerative disk disease    Depression    new onset r/t loss of friends   Hyperlipidemia    Hypertension    Kidney stones 2013   PTSD (post-traumatic stress disorder)    [place]   Stroke    no residual    Patient Active Problem List   Diagnosis Date Noted   Smoker 12/17/2012   Abnormal nuclear cardiac imaging test 12/17/2012   Chronic back pain 12/17/2012   Ischemic cardiomyopathy- EF 45-50% by Buck Mamechov 11/22/12 12/17/2012   High risk sexual behavior 12/17/2012   Hypotension 10/23/2011   Unstable angina (HCC) 10/21/2011   Post traumatic stress disorder (PTSD) 10/21/2011   Anxiety 10/21/2011   CAD  (coronary artery disease) 10/20/2011   Hyperlipidemia 10/20/2011   Kidney calculus 10/20/2011   Depression 10/20/2011   Stroke (HCC) 10/20/2011   Asthma 10/20/2011   Chest pain 10/19/2011    Past Surgical History:  Procedure Laterality Date   CORONARY ANGIOPLASTY WITH STENT PLACEMENT  2005, 2009   LEFT HEART CATHETERIZATION WITH CORONARY ANGIOGRAM N/A 12/17/2012   Procedure: LEFT HEART CATHETERIZATION WITH CORONARY ANGIOGRAM;  Surgeon: Runell GessJonathan J Berry, MD;  [facility];  Service: Cardiovascular;  Laterality: N/A;        Home Medications    Prior to Admission medications   Medication Sig Start Date End Date Taking? Authorizing Provider  albuterol (PROVENTIL HFA;VENTOLIN HFA) 108 (90 BASE) MCG/ACT inhaler Inhale 2 puffs into the lungs every 6 (six) hours as needed for wheezing or shortness of breath.    [provider]  carvedilol (COREG) 6.25 MG tablet Take 3.125 mg by mouth 2 (two) times daily with a meal.    [provider]  clopidogrel (PLAVIX) 75 MG tablet Take 75 mg by mouth daily.    [provider]  lisinopril (PRINIVIL,ZESTRIL) 20 MG tablet Take 10 mg by mouth daily.    [provider]  nitroGLYCERIN (NITROSTAT) 0.4 MG SL tablet Place 0.4 mg under the tongue every 5 (five) minutes as needed. For chest pain  [provider]  oxycodone (ROXICODONE) 30 MG immediate release tablet Take 15-30 mg by mouth every 6 (six) hours as needed for pain.    [provider]    Family History No family history on file.  Social History Social History   Tobacco Use   Smoking status: Current Every Day Smoker    Packs/day: 1.00    Years: 50.00    Pack years: 50.00  Substance Use Topics   Alcohol use: No   Drug use: No     Allergies   Patient has no known allergies.   Review of Systems Review of Systems  Cardiovascular: Positive for chest pain.  Gastrointestinal: Positive for abdominal pain and  diarrhea. Negative for constipation, nausea and vomiting.  All other systems reviewed and are negative.    Physical Exam Updated Vital Signs BP 113/77    Pulse 82    Temp 98.1 F (36.7 C) (Oral)    Resp 18    SpO2 94%   Physical Exam Vitals signs and nursing note reviewed.  Constitutional:      General: He is not in acute distress.    Appearance: He is well-developed.  HENT:     Head: Normocephalic and atraumatic.  Neck:     Musculoskeletal: Neck supple.  Cardiovascular:     Rate and Rhythm: Normal rate and regular rhythm.     Heart sounds: Normal heart sounds. No murmur.  Pulmonary:     Effort: Pulmonary effort is normal. No respiratory distress.     Breath sounds: Normal breath sounds.  Abdominal:     General: There is no distension.     Palpations: Abdomen is soft.     Tenderness: There is no abdominal tenderness.  Skin:    General: Skin is warm and dry.  Neurological:     Mental Status: He is alert and oriented to person, place, and time.      ED Treatments / Results  Labs (all labs ordered are listed, but only abnormal results are displayed) Labs Reviewed  CBC WITH DIFFERENTIAL/PLATELET - Abnormal; Notable for the following components:      Result Value   WBC 20.9 (*)    Neutro Abs 17.5 (*)    Monocytes Absolute 2.3 (*)    Abs Immature Granulocytes 0.15 (*)    All other components within normal limits  COMPREHENSIVE METABOLIC PANEL - Abnormal; Notable for the following components:   Sodium 133 (*)    Potassium 3.4 (*)    Glucose, Bld 127 (*)    Creatinine, Ser 1.55 (*)    Calcium 8.7 (*)    Albumin 3.4 (*)    GFR calc non Af Amer 43 (*)    GFR calc Af Amer 50 (*)    All other components within normal limits  URINALYSIS, ROUTINE W REFLEX MICROSCOPIC - Abnormal; Notable for the following components:   APPearance HAZY (*)    Hgb urine dipstick SMALL (*)    Protein, ur 30 (*)    Nitrite POSITIVE (*)    Leukocytes,Ua SMALL (*)    Bacteria, UA FEW (*)      All other components within normal limits  LACTIC ACID, PLASMA - Abnormal; Notable for the following components:   Lactic Acid, Venous 2.0 (*)    All other components within normal limits  I-STAT CREATININE, ED - Abnormal; Notable for the following components:   Creatinine, Ser 1.50 (*)    All other components within normal limits  TROPONIN I (HIGH  SENSITIVITY) - Abnormal; Notable for the following components:   Troponin I (High Sensitivity) 83 (*)    All other components within normal limits  CULTURE, BLOOD (ROUTINE X 2)  CULTURE, BLOOD (ROUTINE X 2)  URINE CULTURE  SARS CORONAVIRUS 2 (TAT 6-24 HRS)  LIPASE, BLOOD  LACTIC ACID, PLASMA  TROPONIN I (HIGH SENSITIVITY)    EKG EKG Interpretation  Date/Time:  Wednesday June 03 2019 12:51:11 EDT Ventricular Rate:  92 PR Interval:  166 QRS Duration: 142 QT Interval:  436 QTC Calculation: 539 R Axis:   75 Text Interpretation:  Sinus rhythm with Premature atrial complexes Right bundle branch block Minimal voltage criteria for LVH, may be normal variant Inferior infarct , age undetermined Abnormal ECG similar to prior 4/14 Confirmed by Meridee Score [phone]) on 06/03/2019 12:59:13 PM   Radiology Ct Angio Chest/abd/pel For Dissection W And/or Wo Contrast  Result Date: 06/03/2019 CLINICAL DATA:  Acute chest and lower abdominal pain. EXAM: CT ANGIOGRAPHY CHEST, ABDOMEN AND PELVIS TECHNIQUE: Multidetector CT imaging through the chest, abdomen and pelvis was performed using the standard protocol during bolus administration of intravenous contrast. Multiplanar reconstructed images and MIPs were obtained and reviewed to evaluate the vascular anatomy. CONTRAST:  24mL OMNIPAQUE IOHEXOL 350 MG/ML SOLN COMPARISON:  CT scan of October 13, 2017. FINDINGS: CTA CHEST FINDINGS Cardiovascular: Atherosclerosis of thoracic aorta is noted without aneurysm or dissection. Great vessels are widely patent. Normal cardiac size. No pericardial effusion.  Coronary artery calcifications are noted. Mediastinum/Nodes: No enlarged mediastinal, hilar, or axillary lymph nodes. Thyroid gland, trachea, and esophagus demonstrate no significant findings. Lungs/Pleura: No pneumothorax or pleural effusion is noted. Mild emphysematous disease is noted in the upper lobes. Probable scarring is noted in both lung bases. Musculoskeletal: Probable old sternal fracture is noted. No acute osseous abnormality is noted. Review of the MIP images confirms the above findings. CTA ABDOMEN AND PELVIS FINDINGS VASCULAR Aorta: Status post surgical Pera of abdominal aortic aneurysm. The graft and its limbs are widely patent. Celiac: Moderate focal stenosis is noted at the origin secondary to eccentric calcified plaque. SMA: Patent without evidence of aneurysm, dissection, vasculitis or significant stenosis. Renals: Both renal arteries are patent without evidence of aneurysm, dissection, vasculitis, fibromuscular dysplasia or significant stenosis. IMA: Patent without evidence of aneurysm, dissection, vasculitis or significant stenosis. Inflow: Patent without evidence of aneurysm, dissection, vasculitis or significant stenosis. Veins: No obvious venous abnormality within the limitations of this arterial phase study. Review of the MIP images confirms the above findings. NON-VASCULAR Hepatobiliary: No focal liver abnormality is seen. No gallstones, gallbladder wall thickening, or biliary dilatation. Pancreas: Unremarkable. No pancreatic ductal dilatation or surrounding inflammatory changes. Spleen: Normal in size without focal abnormality. Adrenals/Urinary Tract: Adrenal glands are unremarkable. Kidneys are normal, without renal calculi, focal lesion, or hydronephrosis. Bladder is unremarkable. 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 Appendix appears normal. No evidence of bowel wall thickening, distention, or  inflammatory changes. Lymphatic: No significant adenopathy is noted. Reproductive: Prostate is unremarkable. Other: No abdominal wall hernia or abnormality. No abdominopelvic ascites. Musculoskeletal: No acute or significant osseous findings. Review of the MIP images confirms the above findings. IMPRESSION: No evidence of thoracic or abdominal aortic dissection or aneurysm. Status post surgical repair of infrarenal abdominal aortic aneurysm. The graft and its limbs are widely patent. 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 Coronary artery calcifications are noted suggesting coronary artery  disease. Probable old sternal fracture is noted. 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 Aortic Atherosclerosis (ICD10-I70.0) and Emphysema (ICD10-J43.9). Electronically Signed   By: Lupita RaiderJames  Green Jr M.D.   On: 06/03/2019 14:24    Procedures Procedures (including critical care time)  Medications Ordered in ED Medications  sodium chloride 0.9 % bolus 1,000 mL (has no administration in time range)    Followed by  sodium chloride 0.9 % bolus 1,000 mL (has no administration in time range)  acetaminophen (TYLENOL) tablet 1,000 mg (has no administration in time range)  iohexol (OMNIPAQUE) 350 MG/ML injection 80 mL (80 mLs Intravenous Contrast Given 06/03/19 1316)  aspirin chewable tablet 324 mg (324 mg Oral Given 06/03/19 1430)  cefTRIAXone (ROCEPHIN) 1 g in sodium chloride 0.9 % 100 mL IVPB (1 g Intravenous New Bag/Given 06/03/19 1527)     Initial Impression / Assessment and Plan / ED Course  I have reviewed the triage vital signs and the nursing notes.  Pertinent labs & imaging results that were available during my care of the patient were reviewed by me and considered in my medical decision making (see chart for details).  Clinical Course as of Jun 02 1610  Wed Jun 03, 2019  [id number]  78 year old male frail-appearing here feeling unwell with chest and abdominal pain.  He is a rather poor historian.  His work-up included a dissection study that did not show any evidence of dissection.  He has a markedly elevated white count in his urine looks positive along with an elevated troponin.  Will need to be admitted for antibiotics and further work-up.   [MB]    Clinical Course User Index [MB] Terrilee FilesButler, Michael C, MD      Neil Bass is a 78 y.o. male who presents to ED for chest pain / abdominal pain and overall not feeling well for a week or two.  Rectal temperature of 101. VSS. History of AAA s/p repair. Given cp/abd pain, sent for dissection study which was negative for acute pathology.  Labs reviewed with white count of 20.9 and lactic of 2.0. Blood and urine cx's obtained. Troponin 83. EKG without acute changes. Likely demand 2/2 sirs/sepsis but will obtain 2nd trop. Given ASA. UA c/w infection. Started on rocephin. IMTS consulted who will admit.   Patient seen by and discussed with Dr. Charm BargesButler who agrees with treatment plan.    Final Clinical Impressions(s) / ED Diagnoses   Final diagnoses:  SIRS (systemic inflammatory response syndrome) (HCC)  Urinary tract infection with hematuria, site unspecified  Elevated troponin    ED Discharge Orders    None       Chalene Treu, Chase PicketJaime Pilcher, PA-C 06/03/19 1612    Terrilee FilesButler, Michael C, MD 06/03/19 1731

## 2019-06-03 NOTE — ED Triage Notes (Signed)
To room via EMS.  Onset several days chest pain and lower abd pain and swelling, and leg pain.   H/o AAA, per daughter last checked 4-5 years ago.   EMS BP 110/70 HR 90 Temp 98.1 SpO2 95%  Daughter Waseem Suess [phone]

## 2019-06-03 NOTE — ED Notes (Signed)
Admitting doctors at  The bedside 

## 2019-06-03 NOTE — ED Notes (Signed)
Pt vomiting  No order for zofran  Dr Ralene Bathe edp gave order for zofran 4 mg iv

## 2019-06-03 NOTE — H&P (Addendum)
Date: 06/03/2019               Patient Name:  Neil Bass MRN: [id number]  DOB: 04/17/1941 Age / Sex: 78 y.o., male   PCP: Dr. Mellissa [facility]         Medical Service: Internal Medicine Teaching Service         Attending Physician: Dr. Charm Barges, Kayleen Memos, MD    First Contact: Dr. Lyn Hollingshead Pager: [phone]  Second Contact: Dr. Crista Elliot Pager: [phone]       After Hours (After 5p/  First Contact Pager: [phone]  weekends / holidays): Second Contact Pager: [phone]   Chief Complaint: Malaise   History of Present Illness: Part of the history of supplemented by the daughter who we contacted via telephone.   Neil Bass is a 78 y.o male with a hx of PVD, CAD, MI with stent placement (2005, 2009, March 2019), AAA repair with tube grafting (09/2015), polysubstance abuse, tobacco abuse, COPD, chronic back pain secondary to DDD, PTSD, HTN, HLD who presented to the ED after his daughter told him to go to the hospital. The patient had been having increased abdominal pain in which the daughter describes as constantly burning and stabbing that had been progressing over the past 3 days. In addition, the patient's appetite has decreased. He is using the bathroom more often as well. He is urinating more frequently and having bowel movements more frequently as well. His urine color has changed from yellow to orange with lots of bubbles. She states that it is foul-smelling.  As far as bowel movements, the patient has 1 bowel movement every 3 days but has increased frequency to once an hour. Stools are loose and foul-smelling. Daughter describes the color as green to tarry black.    The daughter also states patient has had malaise and weakness coincide with the abdominal pain. He states that he ambulates with a walker but noticed that he cannot go as far before getting tired. Daughter states the patient has not been sleeping well, noting that he has difficulty falling asleep and frequently wakes up at  night. Extremely forgetful at baseline and not capable of taking care of himself.  On review of systems the patient endorses having some chills, chest pain, shortness of breath, but denies any headache, vision changes or new numbness and tingling.  In the ED, the patient had a rectal temperature of 100.2. A CBC with differential, CMP, CK, troponin, lipase, UA, urinalysis, lactate, blood and urine cultures were drawn.  Significant labs include a leukocytosis to 20.9, elevated creatinine to 1.55 (BL 1.0-1.1), decreased GFR to 43, Lactate 2.0 and troponin trend of 83 then 72.  UA positive for small amounts of hemoglobin, + nitrites, small leukocytes and bacteria. CT angio of the chest abdomen and pelvis were ordered given history of AAA to rule out dissection and was unremarkable.  Meds:  No outpatient medications have been marked as taking for the 06/03/19 encounter [employer] Encounter).   Allergies: Allergies as of 06/03/2019   (No Known Allergies)   Past Medical History:  Diagnosis Date   Asthma    uses albuterol every once in a while   Coronary artery disease    Degenerative disk disease    Depression    new onset r/t loss of friends   Hyperlipidemia    Hypertension    Kidney stones 2013   PTSD (post-traumatic stress disorder)    [name]   Stroke  no residual   Surgical History:   Past Surgical History:  Procedure Laterality Date   CORONARY ANGIOPLASTY WITH STENT PLACEMENT  2005, 2009   LEFT HEART CATHETERIZATION WITH CORONARY ANGIOGRAM N/A 12/17/2012   Procedure: LEFT HEART CATHETERIZATION WITH CORONARY ANGIOGRAM;  Surgeon: Runell GessJonathan J Berry, MD;  [facility];  Service: Cardiovascular;  Laterality: N/A;   Family History:   No significant family history   Social History: - [place]. Served for 7 years in [employer]. - Wife died 1 year ago - Lives at home with daughter who is his caretaker.  His daughter gives him all his medications and  helps him with his ADLs and IADLs. - Ambulates with a walker - Smokes cigarettes 1-1.5 PPD - Has used drugs in the past.  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 She is not aware of any other drug use since that time.  - Denies alcohol use  Review of Systems:  All systems were reviewed and are otherwise negative unless mentioned in the HPI.  Imaging: EKG:  Sinus rhythm with Premature atrial complexes Right bundle branch block Minimal voltage criteria for LVH, may be normal variant Inferior infarct , age undetermined Abnormal ECG similar to prior 4/14  Chest Angio Chest/Abd/Pelvis for Dissection: IMPRESSION: No evidence of thoracic or abdominal aortic dissection or aneurysm.  Status post surgical repair of infrarenal abdominal aortic aneurysm. The graft and its limbs are widely patent.  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  Coronary artery calcifications are noted suggesting coronary artery disease.  Probable old sternal fracture is noted.  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  Physical Exam: Blood pressure 113/77, pulse 82, temperature 98.1 F (36.7 C), temperature source Oral, resp. rate 18, SpO2 94 %.  Physical Exam Vitals signs reviewed.  Constitutional:      General: He is not in acute distress.    Appearance: He is well-developed and normal weight. He is not ill-appearing, toxic-appearing or diaphoretic.  HENT:     Head: Normocephalic and atraumatic.     Nose: Nose normal.  Eyes:     Extraocular Movements: Extraocular movements intact.     Pupils: Pupils are equal, round, and reactive to light.  Cardiovascular:     Rate and Rhythm: Normal rate and regular rhythm.     Pulses: Normal pulses.     Heart sounds: Normal heart sounds. No murmur. No  friction rub. No gallop.   Pulmonary:     Effort: Pulmonary effort is normal. No respiratory distress.     Breath sounds: Normal breath sounds. No wheezing or rales.  Abdominal:     General: Abdomen is flat. Bowel sounds are normal. There is no distension.     Palpations: Abdomen is soft.     Tenderness: There is no abdominal tenderness. There is no guarding.  Musculoskeletal:     Right lower leg: No edema.     Left lower leg: No edema.     Comments: Bilateral lower extremities cool to touch  Neurological:     General: No focal deficit present.     Mental Status: He is alert. Mental status is at baseline.     Comments: Alert and oriented to person place. When asked the year the patient states 2021.    Assessment & Plan by Problem: Active Problems:  Sepsis ([place])  In summary, Mr. Poet is a 78 y.o male with a hx of PVD, CAD, MI with stent placement (2005, 2009, March 2019), AAA repair with tube grafting (09/2015), polysubstance abuse, tobacco abuse, COPD, chronic back pain secondary to DDD, PTSD, HTN, HLD who presented to the hospital after a 3-day onset of generalized weakness, malaise, increased urinary frequency, nausea, diarrhea and abdominal pain. The differential includes UTI, kidney stones, enteritis, and mesenteric ischemia.  However, kidney stones, enteritis, mesenteric ischemia are lower on the differential due to negative findings on CT scan.  #UTI: Patient presented with reported rectal temperature of 101, leukocytosis to 20.9. This in the context generalized abdominal pain, increased urinary frequency, foul-smelling urine, and urinalysis positive for nitrites, leukocytes and bacteria are highly suggestive of UTI.  Patient received a dose of ceftriaxone in the ED. - Continue ceftriaxone - Follow-up blood and urine cultures  #CKD? #AKI: Creatinine elevated to 1.55 on admission (BL 1.0-1.1), but BUN was normal. This may be a reflection of CKD more so than an AKI. - 1 L NS bolus  ordered, over 10 hours. Will reassess fluid status in the morning. Given history of ischemic heart disease, will gently give fluids moving forward. - Bladder scans q8hrs  #Dark Tarry Stools #Diarrhea: Daughter reports recent foul-smelling loose stools that coincide with increased abdominal pain over the last 3 days.  Denies recent antibiotic use, but a viral enteritis or C. difficile infection is still on the differential.  Hemoglobin was 15.3 on admission.  The patient also takes iron pills as well. Low suspicion for GI bleed at this time. - C. difficile PCR ordered  #CAD #Hx of AAA repair (Janurary 2017) #Hx of MI w/ stent placement (2005, 2009, 2014, March 2019): Last echocardiogram was performed in in 2014 (EF 40-45%).  Patient takes metoprolol, aspirin and Plavix at home.  - Echocardiogram ordered - Metoprolol 12.5 mg twice daily - Aspirin 81 mg daily - Plavix 75 mg daily  #Tobacco abuse #COPD -Dulera 2 puffs twice daily  #HLD - Continue home atorvastatin 40 mg  #Depression #PTSD: Hx of suicide attempt via heroin overdose in August 2019. - Consider starting SSRI and psychiatry follow-up in the outpatient setting  #FEN/GI - Diet: Renal - IVF: 1L NS over 10 hours  #DVT Prophylaxis - Lovenox 40 mg subq injections daily  #Code Status: Full   #Dispo: Admit patient to Inpatient with expected length of stay greater than 2 midnights. PT/OT consults ordered  Kobyn Kray is the patient's daughter and primary caretaker [phone])  Signed: Earlene Plater, MD Internal Medicine, PGY1 Pager: [phone]  06/03/2019,7:06 PM

## 2019-06-04 ENCOUNTER — Other Ambulatory Visit: Payer: Self-pay

## 2019-06-04 ENCOUNTER — Inpatient Hospital Stay (HOSPITAL_COMMUNITY): Payer: Medicare Other

## 2019-06-04 ENCOUNTER — Encounter (HOSPITAL_COMMUNITY): Payer: Self-pay

## 2019-06-04 DIAGNOSIS — Z955 Presence of coronary angioplasty implant and graft: Secondary | ICD-10-CM

## 2019-06-04 DIAGNOSIS — B961 Klebsiella pneumoniae [K. pneumoniae] as the cause of diseases classified elsewhere: Secondary | ICD-10-CM

## 2019-06-04 DIAGNOSIS — I10 Essential (primary) hypertension: Secondary | ICD-10-CM

## 2019-06-04 DIAGNOSIS — Z720 Tobacco use: Secondary | ICD-10-CM

## 2019-06-04 DIAGNOSIS — F1910 Other psychoactive substance abuse, uncomplicated: Secondary | ICD-10-CM

## 2019-06-04 DIAGNOSIS — Z7902 Long term (current) use of antithrombotics/antiplatelets: Secondary | ICD-10-CM

## 2019-06-04 DIAGNOSIS — F329 Major depressive disorder, single episode, unspecified: Secondary | ICD-10-CM

## 2019-06-04 DIAGNOSIS — F4310 Post-traumatic stress disorder, unspecified: Secondary | ICD-10-CM

## 2019-06-04 DIAGNOSIS — J449 Chronic obstructive pulmonary disease, unspecified: Secondary | ICD-10-CM

## 2019-06-04 DIAGNOSIS — I2510 Atherosclerotic heart disease of native coronary artery without angina pectoris: Secondary | ICD-10-CM

## 2019-06-04 DIAGNOSIS — Z79899 Other long term (current) drug therapy: Secondary | ICD-10-CM

## 2019-06-04 DIAGNOSIS — N390 Urinary tract infection, site not specified: Secondary | ICD-10-CM

## 2019-06-04 DIAGNOSIS — I252 Old myocardial infarction: Secondary | ICD-10-CM

## 2019-06-04 DIAGNOSIS — G8929 Other chronic pain: Secondary | ICD-10-CM

## 2019-06-04 DIAGNOSIS — R079 Chest pain, unspecified: Secondary | ICD-10-CM

## 2019-06-04 DIAGNOSIS — I739 Peripheral vascular disease, unspecified: Secondary | ICD-10-CM

## 2019-06-04 DIAGNOSIS — M549 Dorsalgia, unspecified: Secondary | ICD-10-CM

## 2019-06-04 DIAGNOSIS — Z915 Personal history of self-harm: Secondary | ICD-10-CM

## 2019-06-04 DIAGNOSIS — Z7982 Long term (current) use of aspirin: Secondary | ICD-10-CM

## 2019-06-04 DIAGNOSIS — N179 Acute kidney failure, unspecified: Secondary | ICD-10-CM

## 2019-06-04 DIAGNOSIS — E785 Hyperlipidemia, unspecified: Secondary | ICD-10-CM

## 2019-06-04 LAB — BASIC METABOLIC PANEL
Anion gap: 12 (ref 5–15)
BUN: 19 mg/dL (ref 8–23)
CO2: 20 mmol/L — ABNORMAL LOW (ref 22–32)
Calcium: 8.2 mg/dL — ABNORMAL LOW (ref 8.9–10.3)
Chloride: 102 mmol/L (ref 98–111)
Creatinine, Ser: 1.63 mg/dL — ABNORMAL HIGH (ref 0.61–1.24)
GFR calc Af Amer: 47 mL/min — ABNORMAL LOW (ref >60)
GFR calc non Af Amer: 40 mL/min — ABNORMAL LOW (ref >60)
Glucose, Bld: 108 mg/dL — ABNORMAL HIGH (ref 70–99)
Potassium: 3.4 mmol/L — ABNORMAL LOW (ref 3.5–5.1)
Sodium: 134 mmol/L — ABNORMAL LOW (ref 135–145)

## 2019-06-04 LAB — ECHOCARDIOGRAM COMPLETE
Height: 72 in
Weight: 2885.38 oz

## 2019-06-04 LAB — BLOOD CULTURE ID PANEL (REFLEXED)

## 2019-06-04 LAB — CBC
HCT: 41.8 % (ref 39.0–52.0)
Hemoglobin: 13.8 g/dL (ref 13.0–17.0)
MCH: 29.1 pg (ref 26.0–34.0)
MCHC: 33 g/dL (ref 30.0–36.0)
MCV: 88 fL (ref 80.0–100.0)
Platelets: 261 10*3/uL (ref 150–400)
RBC: 4.75 MIL/uL (ref 4.22–5.81)
RDW: 14.6 % (ref 11.5–15.5)
WBC: 15.5 10*3/uL — ABNORMAL HIGH (ref 4.0–10.5)
nRBC: 0 % (ref 0.0–0.2)

## 2019-06-04 LAB — SARS CORONAVIRUS 2 (TAT 6-24 HRS): SARS Coronavirus 2: NEGATIVE

## 2019-06-04 MED ORDER — POTASSIUM CHLORIDE CRYS ER 20 MEQ PO TBCR
40.0000 meq | EXTENDED_RELEASE_TABLET | Freq: Once | ORAL | Status: AC
  Administered 2019-06-04: 40 meq via ORAL
  Filled 2019-06-04: qty 2

## 2019-06-04 MED ORDER — SODIUM CHLORIDE 0.9 % IV SOLN
1.0000 g | INTRAVENOUS | Status: DC
  Administered 2019-06-04 – 2019-06-08 (×5): 1 g via INTRAVENOUS
  Filled 2019-06-04 (×5): qty 10

## 2019-06-04 MED ORDER — SODIUM CHLORIDE 0.9 % IV BOLUS
1000.0000 mL | Freq: Once | INTRAVENOUS | Status: AC
  Administered 2019-06-04: 1000 mL via INTRAVENOUS

## 2019-06-04 MED ORDER — INFLUENZA VAC A&B SA ADJ QUAD 0.5 ML IM PRSY
0.5000 mL | PREFILLED_SYRINGE | INTRAMUSCULAR | Status: AC
  Administered 2019-06-05: 0.5 mL via INTRAMUSCULAR
  Filled 2019-06-04 (×2): qty 0.5

## 2019-06-04 NOTE — TOC Initial Note (Addendum)
Transition of Care [facility](TOC) - Initial/Assessment Note    Patient Details  Name: Neil Bass MRN: [id number] Date of Birth: 01/09/1941  Transition of Care [facility](TOC) CM/SW Contact:    Leone Havenaylor, Charlton Boule Clinton, RN Phone Number: 06/04/2019, 3:46 PM  Clinical Narrative:                 From home with daughter , Neil Bass, per pt eval rec SNF for patient.  Patient is agreeable to go to SNF at dc and states that NCM can fax out referrals to SNF for him.  TOC team will continue to follow for dc needs.  NCM tried to call [facility] at number listed it is disconnected.  NCM asked patient for phone number for Neil Bass, he gave the phone number of [phone] , this number is for his mother in law , Neil Bass.  Joy states she will have [facility] call this NCM  Back. The address we have listed in system is for Joy his mother n law and he does not live with her.  [facility] [phone], she states their [place] is [address], [place] [id number].  He uses a walker and a w/chair and bsc at home. [place] , awaiting bed offers.   9/18 Letha Capeeborah Amaziah Ghosh RN, BSN- NCM spoke with daughter , Neil Bass and gave her name of facilities that gave bed offers for patient. Awaiting call back.  Neil Bass called back and states her preference is [facility], NCM spoke with Neil Bass she states the covid test on 9/16 is good and that if patient is dc over weekend to call Neil Bass at [phone].   Expected Discharge Plan: Skilled Nursing Facility Barriers to Discharge: No Barriers Identified   Patient Goals and CMS Choice Patient states their goals for this hospitalization and ongoing recovery are:: to get better CMS Medicare.gov Compare Post Acute Care list provided to:: Patient Choice offered to / list presented to : Patient  Expected Discharge Plan and Services Expected Discharge Plan: Skilled Nursing Facility   Discharge Planning Services: CM Consult Post Acute  Care Choice: NA Living arrangements for the past 2 months: Apartment Expected Discharge Date: 06/08/19               DME Arranged: (NA)         HH Arranged: NA          Prior Living Arrangements/Services Living arrangements for the past 2 months: Apartment Lives with:: Adult Children Patient language and need for interpreter reviewed:: Yes Do you feel safe going back to the place where you live?: Yes      Need for Family Participation in Patient Care: Yes (Comment) Care giver support system in place?: Yes (comment)   Criminal Activity/Legal Involvement Pertinent to Current Situation/Hospitalization: No - Comment as needed  Activities of Daily Living Home Assistive Devices/Equipment: Dan HumphreysWalker (specify type) ADL Screening (condition at time of admission) Patient's cognitive ability adequate to safely complete daily activities?: Yes Is the patient deaf or have difficulty hearing?: No Does the patient have difficulty seeing, even when wearing glasses/contacts?: No Does the patient have difficulty concentrating, remembering, or making decisions?: No Patient able to express need for assistance with ADLs?: Yes Does the patient have difficulty dressing or bathing?: No Independently performs ADLs?: Yes (appropriate for developmental age) Does the patient have difficulty walking or climbing stairs?: Yes Weakness of Legs: Both Weakness of Arms/Hands: None  Permission  Sought/Granted                  Emotional Assessment Appearance:: Appears stated age Attitude/Demeanor/Rapport: Gracious Affect (typically observed): Appropriate Orientation: : Oriented to Self, Oriented to Place, Oriented to  Time, Oriented to Situation Alcohol / Substance Use: Tobacco Use Psych Involvement: No (comment)  Admission diagnosis:  Elevated troponin [R79.89] SIRS (systemic inflammatory response syndrome) (HCC) [R65.10] Urinary tract infection with hematuria, site unspecified [N39.0, R31.9] Patient  Active Problem List   Diagnosis Date Noted  . Sepsis ([place]) 06/03/2019  . Smoker 12/17/2012  . Abnormal nuclear cardiac imaging test 12/17/2012  . Chronic back pain 12/17/2012  . Ischemic cardiomyopathy- EF 45-50% by Leafy Half 11/22/12 12/17/2012  . High risk sexual behavior 12/17/2012  . Hypotension 10/23/2011  . Unstable angina ([place]) 10/21/2011  . Post traumatic stress disorder (PTSD) 10/21/2011  . Anxiety 10/21/2011  . CAD (coronary artery disease) 10/20/2011  . Hyperlipidemia 10/20/2011  . Kidney calculus 10/20/2011  . Depression 10/20/2011  . Stroke ([place]) 10/20/2011  . Asthma 10/20/2011  . Chest pain 10/19/2011   PCP:  System, Pcp Not In Pharmacy:   [id number] [place], [address] [address] [address] Phone: [phone] Fax: [place], [place]. [place] [address] Phone: [phone] Fax: [phone]     Social Determinants of Health (SDOH) Interventions    Readmission Risk Interventions No flowsheet data found.

## 2019-06-04 NOTE — Evaluation (Signed)
Physical Therapy Evaluation Patient Details Name: Neil Bass MRN: [id number] DOB: 06/25/1941 Today's Date: 06/04/2019   History of Present Illness  78 yo admitted with abdominal pain and edema with UTI and sepsis PMhx:PVD, CAD, MI , AAA repair , polysubstance abuse, tobacco abuse, COPD, chronic back pain secondary to DDD, PTSD, HTN, HLD  Clinical Impression  Pt supine on arrival on bedpan and unsure if he was on bedpan. Pt with assist to [facility], total assist for pericare and linen change as all linen soiled with pt unaware and assist to walk around bed to chair. Pt with decreased cognition, mobility, balance, transfers and gait who will benefit from acute therapy to maximize mobility, safety and function. Pt lives with daughter who works and per pt cannot be around during the day. Pt will benefit from daily mobility with nursing including using BSC or toilet not bedpan.     Follow Up Recommendations SNF;Supervision/Assistance - 24 hour    Equipment Recommendations  None recommended by PT    Recommendations for Other Services OT consult     Precautions / Restrictions Precautions Precautions: Fall Restrictions Weight Bearing Restrictions: No      Mobility  Bed Mobility Overal bed mobility: Needs Assistance Bed Mobility: Supine to Sit     Supine to sit: Min assist     General bed mobility comments: cues for sequence with assist to fully elevate trunk with HOB 15 degrees, use of rail, increased time. Roll to left with supervision to remove bedpan  Transfers Overall transfer level: Needs assistance   Transfers: Sit to/from BJ'sStand;Stand Pivot Transfers Sit to Stand: Min assist Stand pivot transfers: Min assist       General transfer comment: initial stand and pivot from bed to [facility] with right knee blocked with assist to rise and turn, cues for sequence. Pt then [facility] with cues for hand placement and assist to rise  Ambulation/Gait Ambulation/Gait  assistance: Min assist Gait Distance (Feet): 15 Feet Assistive device: Rolling walker (2 wheeled) Gait Pattern/deviations: Shuffle;Trunk flexed;Wide base of support   Gait velocity interpretation: <1.8 ft/sec, indicate of risk for recurrent falls General Gait Details: pt with shuffling gait with limited foot clearance with trunk flexed. Cues for posture, proximity to Rw and safety with assist to direct RW around bed, limited by fatigue  Stairs            Wheelchair Mobility    Modified Rankin (Stroke Patients Only)       Balance Overall balance assessment: Needs assistance Sitting-balance support: Feet supported;No upper extremity supported Sitting balance-Leahy Scale: Fair     Standing balance support: Bilateral upper extremity supported Standing balance-Leahy Scale: Poor Standing balance comment: bil UE support in standing                             Pertinent Vitals/Pain Pain Assessment: No/denies pain    Home Living Family/patient expects to be discharged to:: Private residence Living Arrangements: Children Available Help at Discharge: Family;Available PRN/intermittently Type of Home: Apartment Home Access: Level entry     Home Layout: One level Home Equipment: Walker - 2 wheels;Cane - single point;Wheelchair - manual      Prior Function Level of Independence: Independent with assistive device(s)         Comments: walks short distance in home and WC for community. Daughter does the homemaking, pt reports he bathes and dresses on his own  Hand Dominance        Extremity/Trunk Assessment   Upper Extremity Assessment Upper Extremity Assessment: Generalized weakness    Lower Extremity Assessment Lower Extremity Assessment: Generalized weakness    Cervical / Trunk Assessment Cervical / Trunk Assessment: Kyphotic  Communication   Communication: HOH  Cognition Arousal/Alertness: Awake/alert Behavior During Therapy: WFL for tasks  assessed/performed Overall Cognitive Status: Impaired/Different from baseline Area of Impairment: Orientation;Problem solving;Safety/judgement                 Orientation Level: Disoriented to;Time       Safety/Judgement: Decreased awareness of safety;Decreased awareness of deficits   Problem Solving: Slow processing;Requires verbal cues General Comments: pt on bed pan on arrival asking "am I on a bed pan still". pt asking "is there a tv in my room" " want to watch the news" turned on tv pt looked at it then turned it off      General Comments      Exercises     Assessment/Plan    PT Assessment Patient needs continued PT services  PT Problem List Decreased strength;Decreased mobility;Decreased safety awareness;Decreased activity tolerance;Decreased cognition;Decreased balance;Decreased knowledge of use of DME;Decreased knowledge of precautions       PT Treatment Interventions DME instruction;Therapeutic activities;Cognitive remediation;Gait training;Therapeutic exercise;Patient/family education;Functional mobility training;Stair training;Balance training    PT Goals (Current goals can be found in the Care Plan section)  Acute Rehab PT Goals Patient Stated Goal: watch the news PT Goal Formulation: With patient Time For Goal Achievement: 06/18/19 Potential to Achieve Goals: Fair    Frequency Min 3X/week   Barriers to discharge Decreased caregiver support daughter works    Co-evaluation               AM-PAC PT "6 Clicks" Mobility  Outcome Measure Help needed turning from your back to your side while in a flat bed without using bedrails?: A Little Help needed moving from lying on your back to sitting on the side of a flat bed without using bedrails?: A Little Help needed moving to and from a bed to a chair (including a wheelchair)?: A Little Help needed standing up from a chair using your arms (e.g., wheelchair or bedside chair)?: A Little Help needed to  walk in hospital room?: A Little Help needed climbing 3-5 steps with a railing? : A Lot 6 Click Score: 17    End of Session Equipment Utilized During Treatment: Gait belt Activity Tolerance: Patient tolerated treatment well Patient left: in chair;with call bell/phone within reach;with chair alarm set;with nursing/sitter in room Nurse Communication: Mobility status;Precautions PT Visit Diagnosis: Other abnormalities of gait and mobility (R26.89);Muscle weakness (generalized) (M62.81)    Time: 7824-2353 PT Time Calculation (min) (ACUTE ONLY): 25 min   Charges:   PT Evaluation $PT Eval Moderate Complexity: 1 Mod PT Treatments $Therapeutic Activity: 8-22 mins        Taelon Bendorf Pam Drown, PT Acute Rehabilitation Services Pager: [phone] Office: [phone]   Sharna Gabrys B Alecia Doi 06/04/2019, 12:12 PM

## 2019-06-04 NOTE — Progress Notes (Signed)
Echocardiogram 2D Echocardiogram has been performed.  Oneal Deputy Willona Phariss 06/04/2019, 2:30 PM

## 2019-06-04 NOTE — Progress Notes (Signed)
   Subjective: Pt seen at the bedside this AM. Says he has to go to the bathroom. Doesn't complain of abdominal pain this AM. No complaints at this time.   Objective:  Vital signs in last 24 hours: Vitals:   06/03/19 2300 06/03/19 2348 06/04/19 0312 06/04/19 0411  BP: 94/61 (!) 106/53  (!) 95/47  Pulse: 98 97  93  Resp:  16  20  Temp:  99.7 F (37.6 C)  98.5 F (36.9 C)  TempSrc:  Oral  Oral  SpO2: 98% 95%  90%  Weight:   81.8 kg   Height:   6' (1.829 m)    Physical Exam: General: Laying in bed, NAD HEENT: NCAT CV: RRR, normal S1 & S2, no murmurs rubs or gallops appreciated PULM: CTAB ABD: No tenderness to palpation in all quadrants  NEURO: Alert and oriented x4, no focal deficits.  Assessment/Plan:  Active Problems:   Sepsis (Neil Bass)  In summary, Neil Bass is a 78 y.o male with a hx of PVD, CAD, MI with stent placement (2005, 2009, March 2019), AAA repair with tube grafting (09/2015), polysubstance abuse, tobacco abuse, COPD, chronic back pain secondary to DDD, PTSD, HTN, HLD who presented to the hospital after a 3-day onset of generalized weakness, malaise, increased urinary frequency, nausea, diarrhea and abdominal pain. The differential includes UTI, kidney stones, enteritis, and mesenteric ischemia. However, kidney stones, enteritis, mesenteric ischemia are lower on the differential due to negative findings on CT scan.  #UTI: Patient presented with reported rectal temperature of 101, leukocytosis to 20.9. This in the context generalized abdominal pain, increased urinary frequency, foul-smelling urine, and urinalysis positive for nitrites, leukocytes and bacteria are highly suggestive of UTI.  Patient received a dose of ceftriaxone in the ED. urine cultures are growing gram-negative rods but speciation is still pending. - Continue ceftriaxone - Follow-up blood and urine cultures  #CKD? #AKI: Creatinine elevated to 1.55 on admission and increased to 1.63 (BL 1.0-1.1) but BUN  was normal. This may be a reflection of CKD more so than an AKI. Received 1L NS bolus yesterday. - Ordered another 1L NS bolus. Given history of ischemic heart disease, will gently give fluids moving forward. - Bladder scans q8hrs - Daily BMPs  #CAD #Hx of AAA repair (Janurary 2017) #Hx of MI w/ stent placement (2005, 2009, 2014, March 2019): Last echocardiogram was performed in in 2014 (EF 40-45%).  Patient takes metoprolol, aspirin and Plavix at home.  - Echocardiogram  completed, results pending - Metoprolol 12.5 mg twice daily - Aspirin 81 mg daily - Plavix 75 mg daily  #Tobacco abuse #COPD - Dulera 2 puffs twice daily  #HLD - Continue home atorvastatin 40 mg  #Depression #PTSD: Hx of suicide attempt via heroin overdose in August 2019. - Consider starting SSRI and psychiatry follow-up in the outpatient setting  #FEN/GI - Diet: Renal - IVF: 1L NS over 10 hours  #DVT Prophylaxis - Lovenox 40 mg subq injections daily  #Code Status: Full   #Dispo: Admit patient to Inpatient with expected length of stay greater than 2 midnights. PT/OT consults ordered  Neil Bass is the patient's daughter and primary caretaker [phone])  Earlene Plater, MD Internal Medicine, PGY1 Pager: [phone]  06/04/2019,1:43 PM

## 2019-06-04 NOTE — NC FL2 (Signed)
[place] MEDICAID FL2 LEVEL OF CARE SCREENING TOOL     IDENTIFICATION  Patient Name: Neil Bass Birthdate: 01/02/1941 Sex: male Admission Date (Current Location): 06/03/2019  [facility] [place]   Facility and Address:  [facility] [place], [address], [place], [place] [address]      Provider Number: [id number]  Attending Physician Name and Address:  Earl LagosNarendra, Nischal, MD  Relative Name and Phone Number:  [name] (daughter)  [phone]    Current Level of Care: Hospital Recommended Level of Care: Skilled Nursing Facility Prior Approval Number:    Date Approved/Denied:   PASRR Number: [id number]  Discharge Plan: SNF    Current Diagnoses: Patient Active Problem List   Diagnosis Date Noted  . Sepsis (HCC) 06/03/2019  . Smoker 12/17/2012  . Abnormal nuclear cardiac imaging test 12/17/2012  . Chronic back pain 12/17/2012  . Ischemic cardiomyopathy- EF 45-50% by Buck Mamechov 11/22/12 12/17/2012  . High risk sexual behavior 12/17/2012  . Hypotension 10/23/2011  . Unstable angina (HCC) 10/21/2011  . Post traumatic stress disorder (PTSD) 10/21/2011  . Anxiety 10/21/2011  . CAD (coronary artery disease) 10/20/2011  . Hyperlipidemia 10/20/2011  . Kidney calculus 10/20/2011  . Depression 10/20/2011  . Stroke (HCC) 10/20/2011  . Asthma 10/20/2011  . Chest pain 10/19/2011    Orientation RESPIRATION BLADDER Height & Weight     Self, Time, Situation, Place  Normal Incontinent Weight: 81.8 kg(intial weight was ED check twice, Dorothy NT) Height:  6' (182.9 cm)  BEHAVIORAL SYMPTOMS/MOOD NEUROLOGICAL BOWEL NUTRITION STATUS      Incontinent Diet(Renal diet with thin fluid consistency)  AMBULATORY STATUS COMMUNICATION OF NEEDS Skin   Limited Assist Verbally Normal                       Personal Care Assistance Level of Assistance  Bathing, Feeding, Dressing Bathing Assistance: Limited assistance Feeding assistance:  Independent Dressing Assistance: Limited assistance     Functional Limitations Info  Sight, Hearing, Speech Sight Info: Adequate Hearing Info: Adequate Speech Info: Adequate    SPECIAL CARE FACTORS FREQUENCY  PT (By licensed PT), OT (By licensed OT)     PT Frequency: 5x/week OT Frequency: 5x/week            Contractures Contractures Info: Not present    Additional Factors Info  Code Status, Allergies Code Status Info: full Allergies Info: no known allergies           Current Medications (06/04/2019):  This is the current hospital active medication list Current Facility-Administered Medications  Medication Dose Route Frequency Provider Last Rate Last Dose  . acetaminophen (TYLENOL) tablet 650 mg  650 mg Oral Q6H PRN Santos-Sanchez, Chelsea PrimusIdalys, MD       Or  . acetaminophen (TYLENOL) suppository 650 mg  650 mg Rectal Q6H PRN Santos-Sanchez, Idalys, MD      . aspirin EC tablet 81 mg  81 mg Oral Daily Santos-Sanchez, Idalys, MD   81 mg at 06/04/19 1021  . atorvastatin (LIPITOR) tablet 40 mg  40 mg Oral q1800 Burna CashSantos-Sanchez, Idalys, MD   40 mg at 06/03/19 2009  . cefTRIAXone (ROCEPHIN) 1 g in sodium chloride 0.9 % 100 mL IVPB  1 g Intravenous Q24H Santos-Sanchez, Idalys, MD 200 mL/hr at 06/04/19 1322 1 g at 06/04/19 1322  . clopidogrel (PLAVIX) tablet 75 mg  75 mg Oral Daily Santos-Sanchez, Idalys, MD   75 mg at 06/04/19 1022  . enoxaparin (LOVENOX)  injection 40 mg  40 mg Subcutaneous Q24H Welford Roche, MD   40 mg at 06/03/19 2015  . [START ON 06/05/2019] influenza vaccine adjuvanted (FLUAD) injection 0.5 mL  0.5 mL Intramuscular Tomorrow-1000 Narendra, Nischal, MD      . metoprolol succinate (TOPROL-XL) 24 hr tablet 12.5 mg  12.5 mg Oral Daily Santos-Sanchez, Idalys, MD   12.5 mg at 06/04/19 1034  . mometasone-formoterol (DULERA) 100-5 MCG/ACT inhaler 2 puff  2 puff Inhalation BID Santos-Sanchez, Idalys, MD      . sodium chloride flush (NS) 0.9 % injection 3 mL  3 mL  Intravenous Q12H Welford Roche, MD   3 mL at 06/04/19 1038     Discharge Medications: Please see discharge summary for a list of discharge medications.  Relevant Imaging Results:  Relevant Lab Results:   Additional Information soc sec [phone]  Zenon Mayo, RN

## 2019-06-04 NOTE — Evaluation (Signed)
Occupational Therapy Evaluation Patient Details Name: Dorothea OgleJerry L Decaprio MRN: [id number] DOB: 11/22/1940 Today's Date: 06/04/2019    History of Present Illness 78 yo admitted with abdominal pain and edema with UTI and sepsis PMhx:PVD, CAD, MI , AAA repair , polysubstance abuse, tobacco abuse, COPD, chronic back pain secondary to DDD, PTSD, HTN, HLD   Clinical Impression   PTA Pt states that he was independent in ADL (daughter did IADL) Pt pleasantly confused but presenting with decreased cognition, found with small incontinent BM in bed, min A for bed mobility, min A for stand pivot transfer to [facility], then to chair with set up for eating. Pt needed cues for sequencing, cues for problem solving. Pt tearful about [place] service. OT will continue to follow acutely, and Pt will benefit from SNF level care post-acute to maximize safety and independence in ADL and functional transfers - to return to PLOF.    Follow Up Recommendations  SNF;Supervision/Assistance - 24 hour    Equipment Recommendations  Other (comment)(defer to next venue)    Recommendations for Other Services       Precautions / Restrictions Precautions Precautions: Fall Restrictions Weight Bearing Restrictions: No      Mobility Bed Mobility Overal bed mobility: Needs Assistance Bed Mobility: Supine to Sit     Supine to sit: Min assist     General bed mobility comments: cues for sequence with assist to fully elevate trunk, use of rail, increased time  Transfers Overall transfer level: Needs assistance Equipment used: 1 person hand held assist Transfers: Sit to/from [employer] CorporationStand;Stand Pivot Transfers Sit to Stand: Min assist Stand pivot transfers: Min assist       General transfer comment: initial stand and [facility] to rise and turn, cues for sequence. [facility] with cues for hand placement and assist to rise    Balance Overall balance assessment: Needs assistance Sitting-balance  support: Feet supported;No upper extremity supported Sitting balance-Leahy Scale: Fair     Standing balance support: Single extremity supported Standing balance-Leahy Scale: Poor Standing balance comment: bil UE support in standing                           ADL either performed or assessed with clinical judgement   ADL Overall ADL's : Needs assistance/impaired Eating/Feeding: Set up;Sitting Eating/Feeding Details (indicate cue type and reason): once up in the chair, able to self-feed using utensils Grooming: Set up;Sitting   Upper Body Bathing: Minimal assistance;Sitting   Lower Body Bathing: Moderate assistance;Sitting/lateral leans   Upper Body Dressing : Minimal assistance;Sitting   Lower Body Dressing: Moderate assistance   Toilet Transfer: Minimal assistance;Stand-pivot;BSC Toilet Transfer Details (indicate cue type and reason): face to face Toileting- Clothing Manipulation and Hygiene: Maximal assistance;Bed level Toileting - Clothing Manipulation Details (indicate cue type and reason): to clean up from incontinence     Functional mobility during ADLs: Minimal assistance(HHA) General ADL Comments: decreased cognition, requires cues, pleasantly coorperative     Vision         Perception     Praxis      Pertinent Vitals/Pain Pain Assessment: No/denies pain     Hand Dominance Right   Extremity/Trunk Assessment Upper Extremity Assessment Upper Extremity Assessment: Generalized weakness   Lower Extremity Assessment Lower Extremity Assessment: Generalized weakness   Cervical / Trunk Assessment Cervical / Trunk Assessment: Kyphotic   Communication Communication Communication: HOH   Cognition Arousal/Alertness: Awake/alert Behavior During Therapy: [facility]  for tasks assessed/performed Overall Cognitive Status: Impaired/Different from baseline Area of Impairment: Orientation;Problem solving;Safety/judgement;Following commands                  Orientation Level: Disoriented to;Time;Place     Following Commands: Follows one step commands with increased time;Follows one step commands inconsistently Safety/Judgement: Decreased awareness of safety;Decreased awareness of deficits   Problem Solving: Slow processing;Requires verbal cues;Difficulty sequencing General Comments: Pt attempting to use bed pan sideways in bed, required multimodal cues and repeated commands for following directions   General Comments  telemetry re-adjusted after transfer, HR at 105-111 when it came back on    Exercises     Shoulder Instructions      Home Living Family/patient expects to be discharged to:: Private residence Living Arrangements: Children(daughter) Available Help at Discharge: Family;Available PRN/intermittently Type of Home: Apartment Home Access: Level entry     Home Layout: One level     Bathroom Shower/Tub: Producer, television/film/video: Standard     Home Equipment: Environmental consultant - 2 wheels;Cane - single point;Wheelchair - manual;Shower seat          Prior Functioning/Environment Level of Independence: Independent with assistive device(s)        Comments: walks short distance in home and WC for community. Daughter does the homemaking, pt reports he bathes and dresses on his own        OT Problem List: Decreased activity tolerance;Impaired balance (sitting and/or standing);Decreased cognition;Decreased safety awareness      OT Treatment/Interventions: Self-care/ADL training;DME and/or AE instruction;Therapeutic activities;Patient/family education;Balance training    OT Goals(Current goals can be found in the care plan section) Acute Rehab OT Goals Patient Stated Goal: est some lunch OT Goal Formulation: With patient Time For Goal Achievement: 06/18/19 Potential to Achieve Goals: Good ADL Goals Pt Will Perform Grooming: with supervision;standing Pt Will Perform Upper Body Dressing: sitting;with modified  independence Pt Will Perform Lower Body Dressing: with supervision;sit to/from stand Pt Will Transfer to Toilet: with supervision;ambulating Pt Will Perform Toileting - Clothing Manipulation and hygiene: with supervision;sit to/from stand Additional ADL Goal #1: Pt will perform bed mobility at supervision level prior to engaging in ADL  OT Frequency: Min 2X/week   Barriers to D/C:    Daughter is supportive, but works during the day       Co-evaluation              AM-PAC OT "6 Clicks" Daily Activity     Outcome Measure Help from another person eating meals?: A Little Help from another person taking care of personal grooming?: A Little Help from another person toileting, which includes using toliet, bedpan, or urinal?: A Lot Help from another person bathing (including washing, rinsing, drying)?: A Lot Help from another person to put on and taking off regular upper body clothing?: A Little Help from another person to put on and taking off regular lower body clothing?: A Lot 6 Click Score: 15   End of Session Equipment Utilized During Treatment: Gait belt Nurse Communication: Mobility status  Activity Tolerance: Patient tolerated treatment well Patient left: in chair;with call bell/phone within reach;with chair alarm set;with nursing/sitter in room  OT Visit Diagnosis: Unsteadiness on feet (R26.81);Other abnormalities of gait and mobility (R26.89);Muscle weakness (generalized) (M62.81);History of falling (Z91.81);Other symptoms and signs involving cognitive function                Time: 1212-1240 OT Time Calculation (min): 28 min Charges:  OT General Charges $OT Visit: 1 Visit  OT Evaluation $OT Eval Moderate Complexity: 1 Mod OT Treatments $Self Care/Home Management : 8-22 mins  Hulda Humphrey OTR/L Acute Rehabilitation Services Pager: [phone] Office: [phone]  Merri Ray Belvie Iribe 06/04/2019, 4:03 PM

## 2019-06-04 NOTE — Plan of Care (Signed)
  Problem: Education: Goal: Knowledge of General Education information will improve Description Including pain rating scale, medication(s)/side effects and non-pharmacologic comfort measures Outcome: Progressing   

## 2019-06-05 DIAGNOSIS — N183 Chronic kidney disease, stage 3 (moderate): Secondary | ICD-10-CM

## 2019-06-05 DIAGNOSIS — I129 Hypertensive chronic kidney disease with stage 1 through stage 4 chronic kidney disease, or unspecified chronic kidney disease: Secondary | ICD-10-CM

## 2019-06-05 DIAGNOSIS — R197 Diarrhea, unspecified: Secondary | ICD-10-CM

## 2019-06-05 LAB — CBC
HCT: 40.6 % (ref 39.0–52.0)
Hemoglobin: 13.3 g/dL (ref 13.0–17.0)
MCH: 29.1 pg (ref 26.0–34.0)
MCHC: 32.8 g/dL (ref 30.0–36.0)
MCV: 88.8 fL (ref 80.0–100.0)
Platelets: 245 10*3/uL (ref 150–400)
RBC: 4.57 MIL/uL (ref 4.22–5.81)
RDW: 14.8 % (ref 11.5–15.5)
WBC: 7.7 10*3/uL (ref 4.0–10.5)
nRBC: 0 % (ref 0.0–0.2)

## 2019-06-05 LAB — BASIC METABOLIC PANEL
Anion gap: 10 (ref 5–15)
BUN: 23 mg/dL (ref 8–23)
CO2: 18 mmol/L — ABNORMAL LOW (ref 22–32)
Calcium: 8.3 mg/dL — ABNORMAL LOW (ref 8.9–10.3)
Chloride: 107 mmol/L (ref 98–111)
Creatinine, Ser: 1.66 mg/dL — ABNORMAL HIGH (ref 0.61–1.24)
GFR calc Af Amer: 46 mL/min — ABNORMAL LOW (ref >60)
GFR calc non Af Amer: 39 mL/min — ABNORMAL LOW (ref >60)
Glucose, Bld: 104 mg/dL — ABNORMAL HIGH (ref 70–99)
Potassium: 3.9 mmol/L (ref 3.5–5.1)
Sodium: 135 mmol/L (ref 135–145)

## 2019-06-05 LAB — CULTURE, BLOOD (ROUTINE X 2): Special Requests: ADEQUATE

## 2019-06-05 LAB — URINE CULTURE: Culture: >=100000 — AB

## 2019-06-05 MED ORDER — SODIUM CHLORIDE 0.9 % IV BOLUS: 1000.0000 mL | Freq: Once | INTRAVENOUS | Status: DC

## 2019-06-05 NOTE — Discharge Summary (Addendum)
Name: Neil Bass MRN: [id number] DOB: 1940/10/07 78 y.o. PCP: System, Pcp Not In  Date of Admission: 06/03/2019 12:40 PM Date of Discharge:  Attending Physician: Aldine Contes, MD  Discharge Diagnosis: 1. Klebsiella oxytoca UTI 2. Diarrhea   Discharge Medications: Allergies as of 06/08/2019   No Known Allergies     Medication List    TAKE these medications   albuterol 108 (90 Base) MCG/ACT inhaler Commonly known as: VENTOLIN HFA Inhale 2 puffs into the lungs every 6 (six) hours as needed for wheezing or shortness of breath.   aspirin EC 81 MG tablet Take 81 mg by mouth daily.   atorvastatin 80 MG tablet Commonly known as: LIPITOR Take 80 mg by mouth at bedtime.   Baclofen 5 MG Tabs Take 5 mg by mouth 3 (three) times daily as needed.   budesonide-formoterol 80-4.5 MCG/ACT inhaler Commonly known as: SYMBICORT Inhale 2 puffs into the lungs 2 (two) times daily.   cephALEXin 500 MG capsule Commonly known as: KEFLEX Take 1 capsule (500 mg total) by mouth 2 (two) times daily for 1 day.   clopidogrel 75 MG tablet Commonly known as: PLAVIX Take 75 mg by mouth daily.   ferrous sulfate 325 (65 FE) MG tablet Take 325 mg by mouth daily with breakfast. If causes constipation reduce the dose to 3 days a week   FLUoxetine 20 MG capsule Commonly known as: PROZAC Take 20 mg by mouth daily.   metoprolol succinate 50 MG 24 hr tablet Commonly known as: TOPROL-XL Take 25 mg by mouth daily.   nitroGLYCERIN 0.4 MG SL tablet Commonly known as: NITROSTAT Place 0.4 mg under the tongue every 5 (five) minutes as needed. For chest pain   oxycodone 30 MG immediate release tablet Commonly known as: ROXICODONE Take 15-30 mg by mouth every 6 (six) hours as needed for pain.   vitamin B-12 1000 MCG tablet Commonly known as: CYANOCOBALAMIN Take 500 mcg by mouth daily.   Vitamin D-1000 Max St 25 MCG (1000 UT) tablet Generic drug: Cholecalciferol Take 2,000 Units by mouth  daily.      Disposition and follow-up:   [name] was discharged from [facility] in Stable condition.  At the hospital follow up visit please address:  1. Klebsiella oxytoca UTI: Patient initially presented with generalized malaise, fatigue and increased urinary frequency.  Urinalysis was suggestive of UTI.  Started on ceftriaxone prior to switching to oral regimen of Keflex 500 mg twice daily upon day of discharge. Urine cultures grew Klebsiella oxytoca. - Discharged with Keflex 500 mg BID to complete 7-day course of antibiotics (stop date 9/22)  2. Diarrhea: The patient's daughter who is his caretaker said that he was having frequent loose stools.  This patient also complained of this upon admission. We took a look at the stool at the bedside commode and appeared reasonably well formed and non-watery. Very low suspicion for C. Difficile. - May follow this up with PCP  3.  Labs / imaging needed at time of follow-up: CBC, BMP urinalysis  4.  Pending labs/ test needing follow-up: none  Follow-up Appointments:  Contact information for follow-up providers    [place]. Go on 06/09/2019.   Specialty: Internal Medicine Why: @1 :40pm Contact information: [place] [phone]           Contact information for after-discharge care    [facility] .  Service: Skilled Nursing Contact information: [address] [place] [address] [phone]                 Hospital Course by problem list:  1. Klebsiella oxytoca UTI 2. Diarrhea  In summary,[name] is a 78 y.o male with a hx ofPVD, CAD, MI with stent placement (2005, 2009, March 2019), AAA repair with tube grafting (09/2015), polysubstance abuse, tobacco abuse, COPD, chronic back pain secondary to DDD,PTSD, HTN, HLDwho presented to the hospital for Klebsiella oxytoca UTI. Pt also  endorsed frequent watery stools.  We treated the UTI with IV ceftriaxone then transitioned to oral Keflex upon discharge. For the diarrhea, the patient endorsed going over 10 times a night while in the hospital.  We looked at the stool formed at the bedside commode and it appeared reasonably well formed.  Patient was not complaining of abdominal pain so we had a very low suspicion for C. difficile. - Keflex 500 mg BID to complete a 7-day course of antibiotics (stop date 9/22) - May follow-up loose stools with PCP   Discharge Vitals:   BP 114/84 (BP Location: Left Arm)   Pulse 92   Temp 97.7 F (36.5 C) (Oral)   Resp 19   Ht 6' (1.829 m)   Wt 76.9 kg   SpO2 93%   BMI 22.99 kg/m   Pertinent Labs, Studies, and Procedures:  CBC Latest Ref Rng & Units 06/06/2019 06/05/2019 06/04/2019  WBC 4.0 - 10.5 K/uL 6.0 7.7 15.5(H)  Hemoglobin 13.0 - 17.0 g/dL 59.5 63.8 75.6  Hematocrit 39.0 - 52.0 % 39.9 40.6 41.8  Platelets 150 - 400 K/uL 239 245 261   BMP Latest Ref Rng & Units 06/07/2019 06/06/2019 06/05/2019  Glucose 70 - 99 mg/dL 433(I) 951(O) 841(Y)  BUN 8 - 23 mg/dL 12 18 23   Creatinine 0.61 - 1.24 mg/dL 6.06(T) 0.16(W) 1.09(N)  Sodium 135 - 145 mmol/L 135 134(L) 135  Potassium 3.5 - 5.1 mmol/L 3.9 3.3(L) 3.9  Chloride 98 - 111 mmol/L 108 106 107  CO2 22 - 32 mmol/L 20(L) 16(L) 18(L)  Calcium 8.9 - 10.3 mg/dL 8.2(L) 8.2(L) 8.3(L)   CT Angio chest/abd/pevlis IMPRESSION: No evidence of thoracic or abdominal aortic dissection or aneurysm.  Status post surgical repair of infrarenal abdominal aortic aneurysm. The graft and its limbs are widely patent.  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  Coronary artery calcifications are noted suggesting coronary artery disease.  Probable old sternal fracture is noted.  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   Urinalysis    Component Value Date/Time   COLORURINE YELLOW 06/03/2019 1429   APPEARANCEUR HAZY (A) 06/03/2019 1429   LABSPEC 1.025 06/03/2019 1429   PHURINE 5.0 06/03/2019 1429   GLUCOSEU NEGATIVE 06/03/2019 1429   HGBUR SMALL (A) 06/03/2019 1429   BILIRUBINUR NEGATIVE 06/03/2019 1429   KETONESUR NEGATIVE 06/03/2019 1429   PROTEINUR 30 (A) 06/03/2019 1429   NITRITE POSITIVE (A) 06/03/2019 1429   LEUKOCYTESUR SMALL (A) 06/03/2019 1429   CT angio chest/abd/pevlis for dissection: IMPRESSION: No evidence of thoracic or abdominal aortic dissection or aneurysm.  Status post surgical repair of infrarenal abdominal aortic aneurysm. The graft and its limbs are widely patent.  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  Coronary artery calcifications are noted suggesting coronary artery disease.  Probable old  sternal fracture is noted.  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  Discharge Instructions:   Signed: Kirt BoysAndrew Victoriana Aziz, MD Internal Medicine, PGY1 Pager: [phone]  06/07/2019,1:07 PM

## 2019-06-05 NOTE — Progress Notes (Addendum)
   Subjective: Pt seen at the bedside this AM. Patient reports 12 episodes of diarrhea overnight. Says this is new since admission. Reports the stool is dark brown and sometimes light, very watery. Denies blood in stool. Reports vomited once yesterday, a small amount.  Objective:  Vital signs in last 24 hours: Vitals:   06/04/19 1030 06/04/19 1622 06/05/19 0307 06/05/19 0309  BP: 106/61 112/63  99/60  Pulse: 83 73  (!) 51  Resp:  18  20  Temp:  97.8 F (36.6 C)  97.7 F (36.5 C)  TempSrc:  Oral  Oral  SpO2:  97%  93%  Weight:   76.9 kg   Height:       Physical Exam: General: Sitting up in bed eating breakfast, NAD HEENT: NCAT CV: RRR, normal S1 & S2, no murmurs rubs or gallops appreciated PULM: CTAB ABD: No tenderness to palpation in all quadrants NEURO: Alert and oriented x4, no focal deficits  Assessment/Plan:  Active Problems:   Sepsis ([place])  In summary, Neil Bass is a 78 y.o male with a hx of PVD, CAD, MI with stent placement (2005, 2009, March 2019), AAA repair with tube grafting (09/2015), polysubstance abuse, tobacco abuse, COPD, chronic back pain secondary to DDD, PTSD, HTN, HLD who presented to the hospital for Klebsiella oxytoca UTI. Pt also endorses frequent watery stools.   #Klebsiella oxytoca UTI: Patient presented with reported rectal temperature of 101, leukocytosis to 20.9. This in the context generalized abdominal pain, increased urinary frequency, foul-smelling urine, and urinalysis positive for nitrites, leukocytes and bacteria and cultures grew Klebsiella oxytoca but sensitivities are pending.  Leukocytosis has resolved (7.7) - Continue ceftriaxone (day 3) - Follow-up blood and urine cultures & sensitivities  #Diarrhea: Daughter reported frequent, foul-smelling and loose in stools that started 3 days prior to admission.  The patient today is complaining of diarrhea as well. - C. difficile PCR ordered - GI viral panel ordered - Enteric precautions  #CAD  #Hx of AAA repair (Janurary 2017) #Hx of MI w/ stent placement (2005, 2009, 2014, March 2019): Last echocardiogram was performed in in 2014 (EF 40-45%). Repeat echocardiogram was performed during the stay and was significant for an EF of 35 to 40%. Patient takes metoprolol, aspirin and Plavix at home. - Metoprolol 12.5 mg twice daily - Aspirin 81 mg daily - Plavix 75 mg daily  #CKD stage III: Creatinine elevated to 1.55 on admission and increased to 1.66 today - Monitor I/O - Daily BMPs  #Tobacco abuse #COPD - Dulera 2 puffs twice daily  #HLD - Continue home atorvastatin 40 mg  #Depression #PTSD: Hx of suicide attempt via heroin overdose in August 2019. - Consider starting SSRI and psychiatry follow-up in the outpatient setting  #FEN/GI - Diet: Renal - IVF: 1L NS over 10 hours  #DVT Prophylaxis - Lovenox 40 mg subq injections daily  #Code Status: Full   #Dispo: Pending medical course.  Neil Bass is the patient's daughter and primary caretaker [phone])  Earlene Plater, MD Internal Medicine, PGY1 Pager: [phone]  06/05/2019,6:49 AM

## 2019-06-05 NOTE — Progress Notes (Signed)
PHARMACY - PHYSICIAN COMMUNICATION CRITICAL VALUE ALERT - BLOOD CULTURE IDENTIFICATION (BCID)  Neil Bass is an 78 y.o. male who presented to [facility] on 06/03/2019 with a chief complaint of malaise  Assessment:  Pt growing GPC in 1/4 blood cultures. Nothing on BCID panel. Likely contaminant.  Name of physician (or Provider) Contacted: Dr. Marianna Payment  Current antibiotics: Rocephin  Changes to prescribed antibiotics recommended:  No changes needed to abx  Results for orders placed or performed during the hospital encounter of 06/03/19  Blood Culture ID Panel (Reflexed) (Collected: 06/03/2019  1:47 PM)  Result Value Ref Range   Enterococcus species NOT DETECTED NOT DETECTED   Listeria monocytogenes NOT DETECTED NOT DETECTED   Staphylococcus species NOT DETECTED NOT DETECTED   Staphylococcus aureus (BCID) NOT DETECTED NOT DETECTED   Streptococcus species NOT DETECTED NOT DETECTED   Streptococcus agalactiae NOT DETECTED NOT DETECTED   Streptococcus pneumoniae NOT DETECTED NOT DETECTED   Streptococcus pyogenes NOT DETECTED NOT DETECTED   Acinetobacter baumannii NOT DETECTED NOT DETECTED   Enterobacteriaceae species NOT DETECTED NOT DETECTED   Enterobacter cloacae complex NOT DETECTED NOT DETECTED   Escherichia coli NOT DETECTED NOT DETECTED   Klebsiella oxytoca NOT DETECTED NOT DETECTED   Klebsiella pneumoniae NOT DETECTED NOT DETECTED   Proteus species NOT DETECTED NOT DETECTED   Serratia marcescens NOT DETECTED NOT DETECTED   Haemophilus influenzae NOT DETECTED NOT DETECTED   Neisseria meningitidis NOT DETECTED NOT DETECTED   Pseudomonas aeruginosa NOT DETECTED NOT DETECTED   Candida albicans NOT DETECTED NOT DETECTED   Candida glabrata NOT DETECTED NOT DETECTED   Candida krusei NOT DETECTED NOT DETECTED   Candida parapsilosis NOT DETECTED NOT DETECTED   Candida tropicalis NOT DETECTED NOT DETECTED    Sherlon Handing, PharmD, BCPS 06/05/2019  12:12 AM

## 2019-06-06 DIAGNOSIS — F1990 Other psychoactive substance use, unspecified, uncomplicated: Secondary | ICD-10-CM

## 2019-06-06 LAB — MAGNESIUM: Magnesium: 2 mg/dL (ref 1.7–2.4)

## 2019-06-06 LAB — CBC
HCT: 39.9 % (ref 39.0–52.0)
Hemoglobin: 13.8 g/dL (ref 13.0–17.0)
MCH: 30.3 pg (ref 26.0–34.0)
MCHC: 34.6 g/dL (ref 30.0–36.0)
MCV: 87.5 fL (ref 80.0–100.0)
Platelets: 239 10*3/uL (ref 150–400)
RBC: 4.56 MIL/uL (ref 4.22–5.81)
RDW: 14.6 % (ref 11.5–15.5)
WBC: 6 10*3/uL (ref 4.0–10.5)
nRBC: 0 % (ref 0.0–0.2)

## 2019-06-06 LAB — BASIC METABOLIC PANEL
Anion gap: 12 (ref 5–15)
BUN: 18 mg/dL (ref 8–23)
CO2: 16 mmol/L — ABNORMAL LOW (ref 22–32)
Calcium: 8.2 mg/dL — ABNORMAL LOW (ref 8.9–10.3)
Chloride: 106 mmol/L (ref 98–111)
Creatinine, Ser: 1.49 mg/dL — ABNORMAL HIGH (ref 0.61–1.24)
GFR calc Af Amer: 52 mL/min — ABNORMAL LOW (ref >60)
GFR calc non Af Amer: 45 mL/min — ABNORMAL LOW (ref >60)
Glucose, Bld: 153 mg/dL — ABNORMAL HIGH (ref 70–99)
Potassium: 3.3 mmol/L — ABNORMAL LOW (ref 3.5–5.1)
Sodium: 134 mmol/L — ABNORMAL LOW (ref 135–145)

## 2019-06-06 MED ORDER — POTASSIUM CHLORIDE CRYS ER 20 MEQ PO TBCR
40.0000 meq | EXTENDED_RELEASE_TABLET | Freq: Four times a day (QID) | ORAL | Status: AC
  Administered 2019-06-06 (×2): 40 meq via ORAL
  Filled 2019-06-06 (×2): qty 2

## 2019-06-06 MED ORDER — LACTATED RINGERS IV BOLUS
500.0000 mL | Freq: Once | INTRAVENOUS | Status: AC
  Administered 2019-06-06: 500 mL via INTRAVENOUS

## 2019-06-06 NOTE — Plan of Care (Signed)

## 2019-06-06 NOTE — Progress Notes (Signed)
Patient bowl moved twice today, but stool was soft and not watery, could not be tested for cdiff per protocol.

## 2019-06-06 NOTE — Progress Notes (Signed)
   Subjective: Patient stated that he had 6 bowel movements overnight.  He denies abdominal cramps but states that the abdominal wounds were of sudden onset, they are watery in nature.  He denies fever, chills, nausea or vomiting.  He is in agreement with going to a skilled nursing facility pending stool testing.  Objective:  Vital signs in last 24 hours: Vitals:   06/05/19 2014 06/06/19 0031 06/06/19 0602 06/06/19 0643  BP: 121/88 105/70 131/89   Pulse: 75 76 62   Resp: 16 15 18    Temp: 98.2 F (36.8 C) 98.2 F (36.8 C) 98.2 F (36.8 C)   TempSrc: Oral Oral Oral   SpO2:      Weight:    76.5 kg  Height:       General: A/O x4, in no acute distress, afebrile, nondiaphoretic HEENT: PEERL, EMO intact Cardio: RRR, no mrg's  Pulmonary: CTA bilaterally, no wheezing or crackles  Abdomen: Bowel sounds normal, soft, nontender  MSK: BLE nontender, nonedematous Psych: Appropriate affect, not depressed in appearance, engages well  Assessment/Plan:  Active Problems:   Sepsis [facility])  Mr. Bi is a 78-year-old male with a past medical history of PVD, CAD, MI with stent placement (does not 5, does not 9, and March 2019), AAA repair with tube grafting in 09/2015, polysubstance use disorder, tobacco use disorder, COPD, chronic back pain, PTSD, HTN, HLD who presented to the hospital for Klebsiella oxytoca UTI.  In addition the patient endorses frequent watery bowel movements. The patient's disposition is pending completion of stool studies.  He is otherwise medically stable for SNF placement once C. difficile has been ruled out.  A/P: Klebsiella UTI: Patient had initial leukocytosis of 20.9 and temperature of 101.  These have now since improved with ceftriaxone, on day 4.  He will need to complete a 14-day course given the complicated nature of his UTI. -Continue ceftriaxone, discharge on Keflex - Follow-up blood and urine cultures and sensitivities  Loose watery stool: Patient began having  symptoms 3 days prior to admission.  This has persisted since admission.  Unfortunately, staff has been unable to collect a stool sample as the patient has not cooperated with attempts.  The patient cannot be discharged to stool studies are collected.  Is disposed pending these.  Patient has mild hypokalemia to 3.3 and mild acidosis with a bicarb of 16 mostly secondary to his ongoing loose watery stool. sCr improved from 1.66 to 1.49. -Replete potassium with 40 mEq x 2 doses -1/2L bolus of LR to offset ongoing GI losses -C. difficile PCR and GI viral panel ordered -Continue enteric precautions until stool studies collected C. difficile ruled out  Dispo: Anticipated discharge in approximately 0-1 day(s).   Kathi Ludwig, MD 06/06/2019, 6:44 AM Pager: # [phone]

## 2019-06-06 NOTE — Progress Notes (Signed)
Pt was out of bed at time of increased heart rate.

## 2019-06-07 LAB — BASIC METABOLIC PANEL
Anion gap: 7 (ref 5–15)
BUN: 12 mg/dL (ref 8–23)
CO2: 20 mmol/L — ABNORMAL LOW (ref 22–32)
Calcium: 8.2 mg/dL — ABNORMAL LOW (ref 8.9–10.3)
Chloride: 108 mmol/L (ref 98–111)
Creatinine, Ser: 1.47 mg/dL — ABNORMAL HIGH (ref 0.61–1.24)
GFR calc Af Amer: 53 mL/min — ABNORMAL LOW (ref >60)
GFR calc non Af Amer: 46 mL/min — ABNORMAL LOW (ref >60)
Glucose, Bld: 113 mg/dL — ABNORMAL HIGH (ref 70–99)
Potassium: 3.9 mmol/L (ref 3.5–5.1)
Sodium: 135 mmol/L (ref 135–145)

## 2019-06-07 LAB — MAGNESIUM: Magnesium: 2 mg/dL (ref 1.7–2.4)

## 2019-06-07 MED ORDER — CEPHALEXIN 500 MG PO CAPS: 500.0000 mg | ORAL_CAPSULE | Freq: Two times a day (BID) | ORAL | 0 refills | Status: DC

## 2019-06-07 NOTE — Progress Notes (Signed)
   Subjective: Pt seen at the bedside this AM.  Patient says he feels about the same as he did yesterday.  Endorses generalized weakness and still having frequent loose bowel movements.  No other complaints at this time.  Objective:  Vital signs in last 24 hours: Vitals:   06/06/19 1730 06/06/19 2057 06/07/19 0628 06/07/19 0944  BP: (!) 141/89 132/77 137/69   Pulse: 94 79 68   Resp: 19 16 16    Temp: 98.1 F (36.7 C) 98.2 F (36.8 C) 98.3 F (36.8 C)   TempSrc: Oral Oral Oral   SpO2: 99%   98%  Weight:      Height:       Physical Exam: General: Sitting up in bed, NAD HEENT: NCAT CV: RRR, normal S1 & S2, no murmurs rubs or gallops appreciated PULM: CTAB ABD: No tenderness to palpation in all quadrants NEURO: Alert and oriented, no focal deficits appreciated  Assessment/Plan:  Active Problems:   Sepsis ([place])  In summary, Mr. Luczak is a 78 y.o male with a hx of PVD, CAD, MI with stent placement (2005, 2009, March 2019), AAA repair with tube grafting (09/2015), polysubstance abuse, tobacco abuse, COPD, chronic back pain secondary to DDD, PTSD, HTN, HLD who presented to the hospital for Klebsiella oxytoca UTI. Pt continues to endorse frequent loose stools at this time.   #Klebsiella oxytoca UTI: Patient presented with reported rectal temperature of 101, leukocytosis to 20.9. This in the context generalized abdominal pain, increased urinary frequency, foul-smelling urine, and urinalysis positive for nitrites, leukocytes and bacteria and cultures grew Klebsiella oxytoca but sensitivities are pending. Leukocytosis has resolved. - Continue ceftriaxone (day 5/14) - Will transition to oral Keflex upon discharge  #Diarrhea: Daughter reported frequent, foul-smelling and loose in stools that started 3 days prior to admission. The patient continues to complain of loose stools today. - C. difficile PCR ordered - GI viral panel ordered - Enteric precautions  #CAD #Hx of AAA repair  (Janurary 2017) #Hx of MI w/ stent placement (2005, 2009, 2014, March 2019): Last echocardiogram was performed in in 2014 (EF 40-45%). Repeat echocardiogram was performed during the stay and was significant for an EF of 35 to 40%. Patient takes metoprolol, aspirin and Plavix at home. - Metoprolol 12.5 mg twice daily - Aspirin 81 mg daily - Plavix 75 mg daily  #CKD stage III: Creatinine elevated to 1.55 on admission and increased to 1.47 today - Monitor I/O - Daily BMPs  #Tobacco abuse #COPD - Dulera 2 puffs twice daily  #HLD - Continue home atorvastatin 40 mg  #Depression #PTSD: Hx of suicide attempt via heroin overdose in August 2019. - Consider starting SSRI and psychiatry follow-up in the outpatient setting  #FEN/GI - Diet: Renal - IVF: 1L NS over 10 hours  #DVT Prophylaxis - Lovenox 40 mg subq injections daily  #Code Status: Full   #Dispo: Pending medical course.  Tanya Marvin is the patient's daughter and primary caretaker [phone])  Earlene Plater, MD Internal Medicine, PGY1 Pager: [phone]  06/07/2019,10:02 AM

## 2019-06-07 NOTE — Progress Notes (Signed)
Verified with MD Earlene Plater that patient will no be d/c today. Patient refusing SNF. Primary nurse aware.

## 2019-06-07 NOTE — TOC Transition Note (Signed)
Transition of Care [facility]) - CM/SW Discharge Note   Patient Details  Name: Neil Bass MRN: [id number] Date of Birth: [birth year]  Transition of Care [facility]) CM/SW Contact:  Bary Castilla, LCSW Phone Number:[phone] 06/07/2019, 12:52 PM   Clinical Narrative:    CSW was alerted that patient was medically stable for discharge.   Patient will DC to:[place]? Anticipated DC date:06/07/19? Family notified:?Hunter daughter Transport IH:[id number]   Per MD patient ready for DC to [facility] RN, patient, patient's family, and facility notified of DC. Discharge Summary sent to facility. RN given number for report  [phone] room 113. DC packet on chart. Ambulance transport requested for patient.   CSW signing off.   [place], [place] [phone]    Final next level of care: Skilled Nursing Facility Barriers to Discharge: No Barriers Identified   Patient Goals and CMS Choice Patient states their goals for this hospitalization and ongoing recovery are:: to get better CMS Medicare.gov Compare Post Acute Care list provided to:: Patient Choice offered to / list presented to : Patient  Discharge Placement              Patient chooses bed at: [facility]) Patient to be transferred to facility by: [place] Name of family member notified: [place] Patient and family notified of of transfer: 06/07/19  Discharge Plan and Services   Discharge Planning Services: CM Consult Post Acute Care Choice: NA          DME Arranged: (NA)         HH Arranged: NA          Social Determinants of Health (SDOH) Interventions     Readmission Risk Interventions No flowsheet data found.

## 2019-06-07 NOTE — TOC Transition Note (Signed)
Transition of Care [facility]) - CM/SW Discharge Note   Patient Details  Name: Neil Bass MRN: [id number] Date of Birth: [birth year]  Transition of Care [facility]) CM/SW Contact:  Bary Castilla, LCSW Phone Number: [phone] 06/07/2019, 4:17 PM   Clinical Narrative:    CSW met with patient bedside due to him refusing to go to SNF. CSW, MD and patient's daughter spoke to him about the benefits of the SNF. Due to patient refusing, MD stated that would follow up with patient in regards to next steps of discharge.  Patient informed CSW that he was amenable to [facility].  CSW team will continue to follow for discharge planning.   Final next level of care: Skilled Nursing Facility Barriers to Discharge: No Barriers Identified   Patient Goals and CMS Choice Patient states their goals for this hospitalization and ongoing recovery are:: to get better CMS Medicare.gov Compare Post Acute Care list provided to:: Patient Choice offered to / list presented to : Patient  Discharge Placement              Patient chooses bed at: [facility]) Patient to be transferred to facility by: [place] Name of family member notified: Neil Bass Patient and family notified of of transfer: 06/07/19  Discharge Plan and Services   Discharge Planning Services: CM Consult Post Acute Care Choice: NA          DME Arranged: (NA)         HH Arranged: NA          Social Determinants of Health (SDOH) Interventions     Readmission Risk Interventions No flowsheet data found.

## 2019-06-08 LAB — CULTURE, BLOOD (ROUTINE X 2): Culture: NO GROWTH

## 2019-06-08 LAB — SARS CORONAVIRUS 2 (TAT 6-24 HRS): SARS Coronavirus 2: NEGATIVE

## 2019-06-08 MED ORDER — OXYCODONE HCL 5 MG PO TABS: 15.0000 mg | ORAL_TABLET | Freq: Four times a day (QID) | ORAL | Status: DC | PRN

## 2019-06-08 MED ORDER — CEPHALEXIN 500 MG PO CAPS: 500.0000 mg | ORAL_CAPSULE | Freq: Two times a day (BID) | ORAL | 0 refills | Status: AC

## 2019-06-08 NOTE — Progress Notes (Signed)
Pt's covid test is negative. PTAR called for transport to [facility]hab.

## 2019-06-08 NOTE — TOC Progression Note (Signed)
Transition of Care [facility]) - Progression Note    Patient Details  Name: Neil Bass MRN: [id number] Date of Birth: [birth year]  Transition of Care [facility]) CM/SW Contact  Zenon Mayo, RN Phone Number: 06/08/2019, 12:20 PM  Clinical Narrative:    Patient is agreeable to go to [place] SN[facility] today, but will need a rapid covid, he will be going to room 113, and RN will need to call report to [phone].  NCM called daughter , Yong Channel, left vm for return call.     Expected Discharge Plan: Skilled Nursing Facility Barriers to Discharge: No Barriers Identified  Expected Discharge Plan and Services Expected Discharge Plan: [place]   Discharge Planning Services: CM Consult Post Acute Care Choice: NA Living arrangements for the past 2 months: Apartment Expected Discharge Date: 06/08/19               DME Arranged: (NA)         HH Arranged: NA           Social Determinants of Health (SDOH) Interventions    Readmission Risk Interventions No flowsheet data found.

## 2019-06-08 NOTE — TOC Progression Note (Signed)
Transition of Care [facility]) - Progression Note    Patient Details  Name: Neil Bass MRN: [id number] Date of Birth: [birth year]  Transition of Care [facility]) CM/SW Contact  Zenon Mayo, RN Phone Number: 06/08/2019, 10:13 AM  Clinical Narrative:    NCM left message for daughter , Neil Bass , to return NCM call.  Patient refused SNF over the weekend, he will need max HH services.   Expected Discharge Plan: Skilled Nursing Facility Barriers to Discharge: No Barriers Identified  Expected Discharge Plan and Services Expected Discharge Plan: [place]   Discharge Planning Services: CM Consult Post Acute Care Choice: NA Living arrangements for the past 2 months: Apartment Expected Discharge Date: 06/07/19               DME Arranged: (NA)         HH Arranged: NA           Social Determinants of Health (SDOH) Interventions    Readmission Risk Interventions No flowsheet data found.

## 2019-06-08 NOTE — Progress Notes (Signed)
Report given to Angie RN of [facility]

## 2019-06-08 NOTE — Progress Notes (Signed)
Internal Medicine Attending:   I saw and examined the patient. I reviewed the resident's note and I agree with the resident's findings and plan as documented in the resident's note.  Patient states that he had 3 loose bowel movements yesterday but denies any abdominal pain.  Patient was initially admitted to the hospital with weakness and increased urinary frequency and was found to have Klebsiella UTI.  Will transition patient to oral Keflex to complete a 7-day course of antibiotics.  Patient's blood cultures are positive for coag negative staph which is a likely contaminant.  Patient also noted to have frequent bowel movements but stool is semi-formed and not loose and the frequency has been improving.  There is very low suspicion for C. difficile at this time.  No further work-up at this time.  Patient stable for DC to SNF once bed is available.

## 2019-06-08 NOTE — Care Management Important Message (Signed)
Important Message  Patient Details  Name: Neil Bass MRN: [id number] Date of Birth: 1941/08/26   Medicare Important Message Given:  Yes     [name] 06/08/2019, 12:12 PM

## 2019-06-08 NOTE — Progress Notes (Signed)
Transportation came to pick up pt. Neil Bass called to inform that pt will be discharged  To [facility] and rehab but nobody is answering the phone.  Pt discharged in good condition.

## 2019-06-08 NOTE — Progress Notes (Signed)
   Subjective: Patient seen at bedside this morning on rounds. Patient states he is not sure that he will get good rehab at a rehab facility. He states he was at a rehab facility before but they only walked him once per day and he is not sure if he would benefit from brief therapy like that. Counseled patient on importance of rehab to help him to walk again. Patient is in agreement with plan to go to rehab. Patient reports continued loose stools (x3 in last day) but denies abdominal pain.  Objective:  Vital signs in last 24 hours: Vitals:   06/07/19 2118 06/08/19 0555 06/08/19 0652 06/08/19 0709  BP: (!) 126/105  96/69 140/77  Pulse: (!) 42  (!) 156 74  Resp: 16  18   Temp: 97.8 F (36.6 C)  97.9 F (36.6 C)   TempSrc: Oral  Oral   SpO2: 95%  94% 91%  Weight:  81.6 kg    Height:       Physical Exam: General: Sitting in bedside chair, NAD HEENT: NCAT CV: RRR, normal S1 & S2, no murmurs rubs or gallops appreciated PULM: CTAB ABD: Nontender to palpation  NEURO: Alert and oriented, no focal deficits appreciated  Assessment/Plan:  Active Problems:   Sepsis ([place])  In summary, Mr. Hardman is a 78 y.o male with a hx of PVD, CAD, MI with stent placement (2005, 2009, March 2019), AAA repair with tube grafting (09/2015), polysubstance abuse, tobacco abuse, COPD, chronic back pain secondary to DDD, PTSD, HTN, HLD who presented to the hospital for Klebsiella oxytoca UTI.    #Klebsiella oxytoca UTI: Patient presented with reported rectal temperature of 101, leukocytosis to 20.9. This in the context generalized abdominal pain, increased urinary frequency, foul-smelling urine, and urinalysis positive for nitrites, leukocytes and bacteria and cultures grew Klebsiella oxytoca but sensitivities are pending. Leukocytosis has resolved. - Continue ceftriaxone (day 6/7). Will discharge on Keflex. Abx stop date 9/22.  #Diarrhea: Daughter reported frequent, foul-smelling and loose in stools that started 3  days prior to admission. The patient continues to complain of loose stools today but is less frequent than when he came in. I saw the stool in the bedside commode yesterday and it was formed. I highly doubt th patient has C diff. or viral enteritis at this time, given negative CT abdomen findings and absence of abdominal pain.   #CAD #Hx of AAA repair (Janurary 2017) #Hx of MI w/ stent placement (2005, 2009, 2014, March 2019): Last echocardiogram was performed in in 2014 (EF 40-45%). Repeat echocardiogram was performed during the stay and was significant for an EF of 35 to 40%. Patient takes metoprolol, aspirin and Plavix at home. - Metoprolol 12.5 mg twice daily - Aspirin 81 mg daily - Plavix 75 mg daily  #CKD stage III: Creatinine elevated to 1.55 on admission and increased to 1.47 yesterday - Monitor I/O - Daily BMPs  #Tobacco abuse #COPD - Dulera 2 puffs twice daily  #HLD - Continue home atorvastatin 40 mg  #Depression #PTSD: Hx of suicide attempt via heroin overdose in August 2019. - Consider starting SSRI and psychiatry follow-up in the outpatient setting  #FEN/GI - Diet: Renal - IVF: 1L NS over 10 hours  #DVT Prophylaxis - Lovenox 40 mg subq injections daily  #Code Status: Full   #Dispo: Discharge to SNF today.  Thurston Brendlinger is the patient's daughter and primary caretaker [phone])  Earlene Plater, MD Internal Medicine, PGY1 Pager: [phone]  06/08/2019,7:53 AM
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
## 2019-06-09 ENCOUNTER — Inpatient Hospital Stay: Payer: Medicare Other | Admitting: Family Medicine

## 2021-03-11 IMAGING — CT CT ANGIO CHEST-ABD-PELV FOR DISSECTION W/ AND WO/W CM
2 of 7 series · 14 of 46 positions shown, 16 images · IV contrast (omnipaque)
Comparison: CT scan of October 13, 2017.

CLINICAL DATA: Acute chest and lower abdominal pain.

EXAM:
CT ANGIOGRAPHY CHEST, ABDOMEN AND PELVIS
TECHNIQUE: Multidetector CT imaging through the chest, abdomen and pelvis was
performed using the standard protocol during bolus administration of
intravenous contrast. Multiplanar reconstructed images and MIPs were
obtained and reviewed to evaluate the vascular anatomy.
CONTRAST:  80mL OMNIPAQUE IOHEXOL 350 MG/ML SOLN

[Series 7: dissection 2mm · axial · 0.86mm/px · z∈[-634,-36]mm · 11 of 337 slices shown, 13 images]
[im 19/337  soft-tissue]
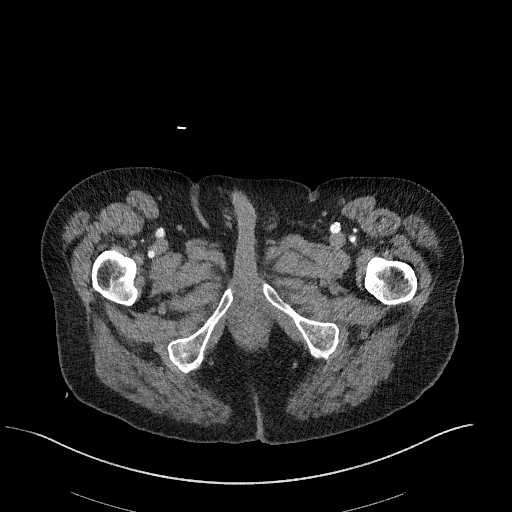
[im 19/337  bone]
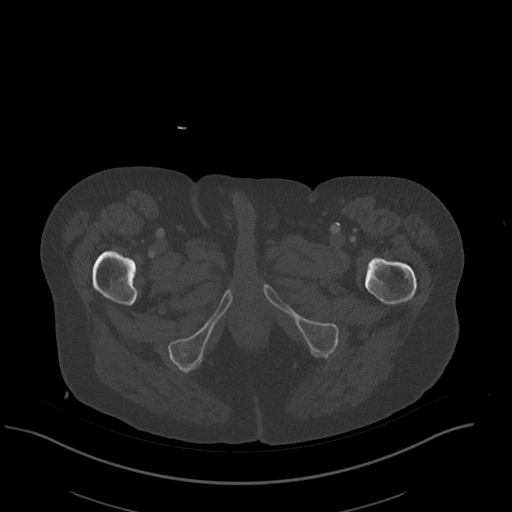
[im 57/337  soft-tissue]
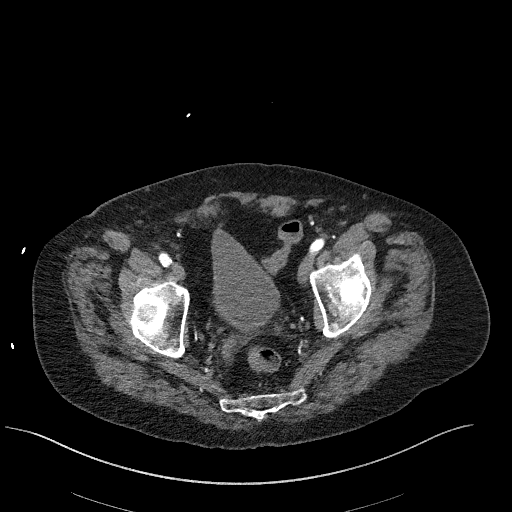
[im 75/337  soft-tissue]
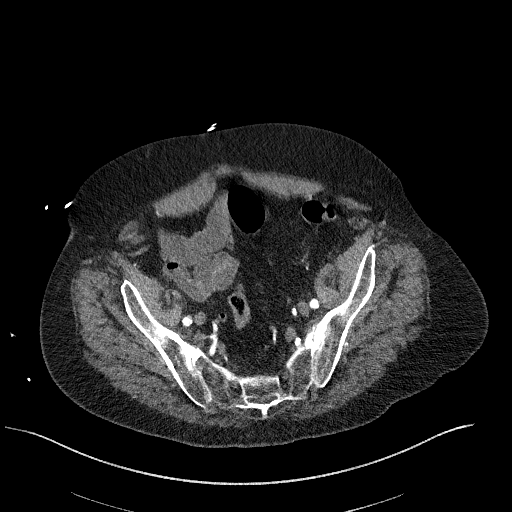
[im 113/337  soft-tissue]
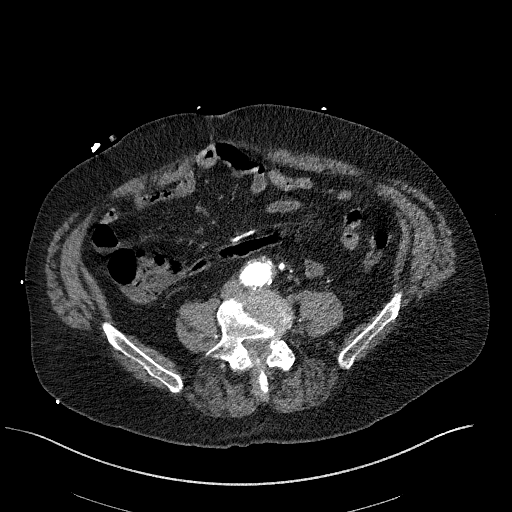
[im 131/337  soft-tissue]
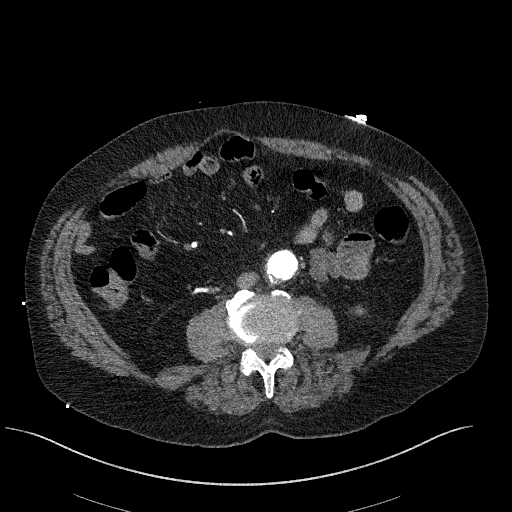
[im 169/337  soft-tissue]
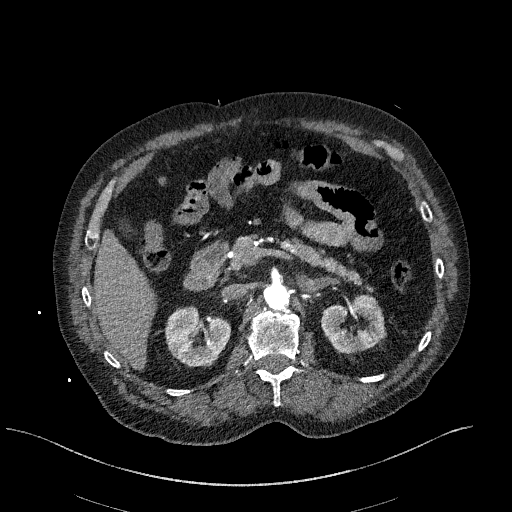
[im 206/337  soft-tissue]
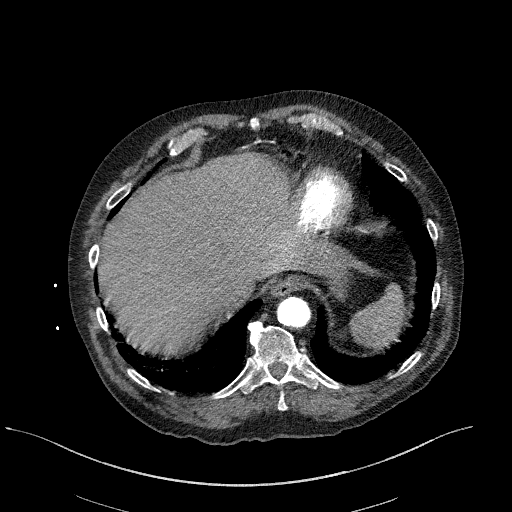
[im 225/337  soft-tissue]
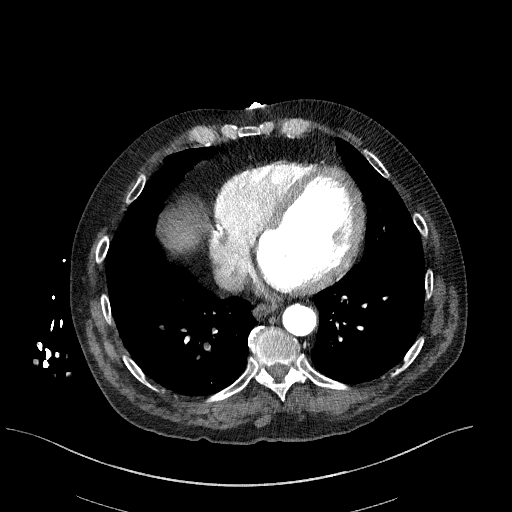
[im 262/337  soft-tissue]
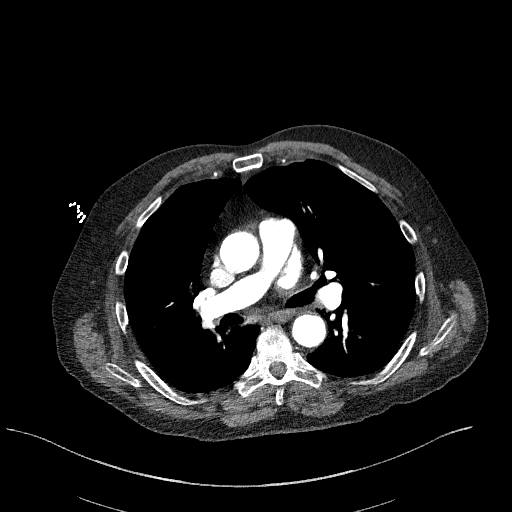
[im 262/337  bone]
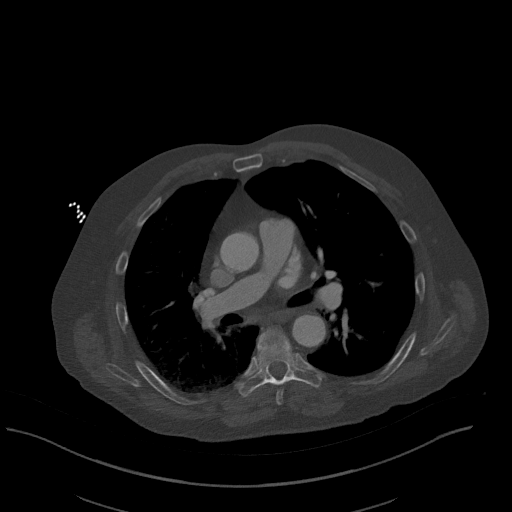
[im 281/337  soft-tissue]
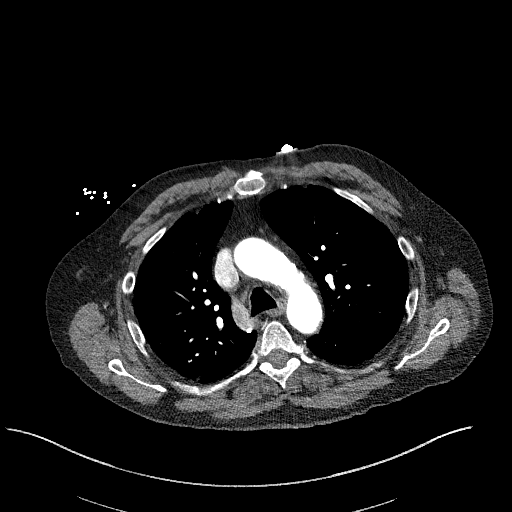
[im 318/337  soft-tissue]
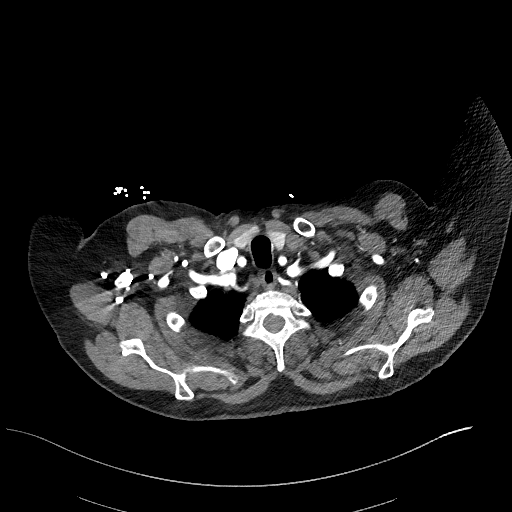

[Series 10: dissection 2mm cor · coronal · 0.83mm/px · 3 of 151 slices shown]
[im 38/151  soft-tissue]
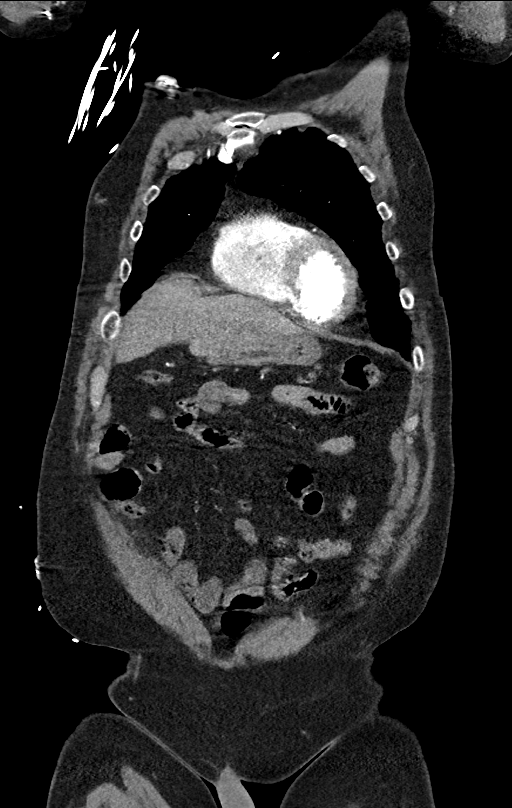
[im 76/151  soft-tissue]
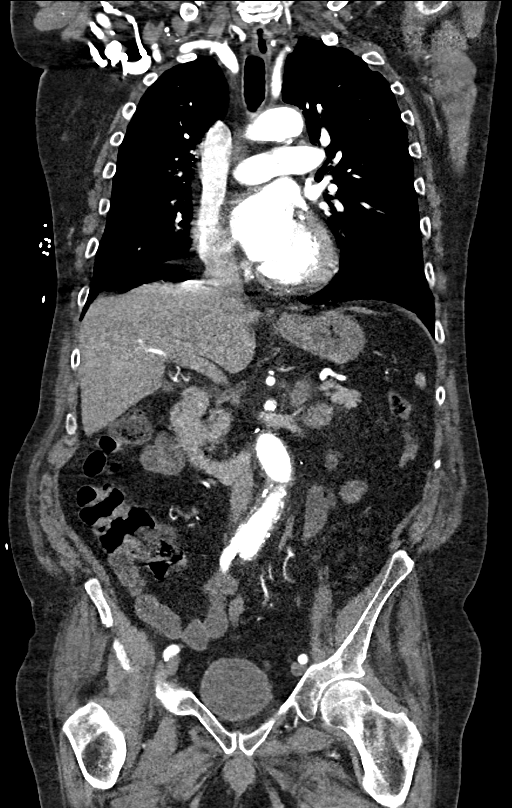
[im 113/151  soft-tissue]
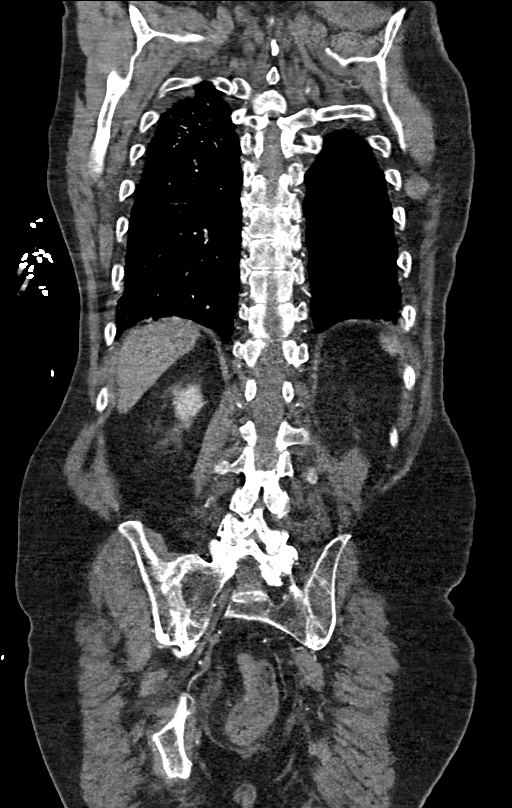

[14 of 46 positions shown; findings below may reference images not displayed]

FINDINGS: CTA CHEST FINDINGS

Cardiovascular: Atherosclerosis of thoracic aorta is noted without
aneurysm or dissection. Great vessels are widely patent. Normal
cardiac size. No pericardial effusion. Coronary artery
calcifications are noted.

Mediastinum/Nodes: No enlarged mediastinal, hilar, or axillary lymph
nodes. Thyroid gland, trachea, and esophagus demonstrate no
significant findings.

Lungs/Pleura: No pneumothorax or pleural effusion is noted. Mild
emphysematous disease is noted in the upper lobes. Probable scarring
is noted in both lung bases.

Musculoskeletal: Probable old sternal fracture is noted. No acute
osseous abnormality is noted.

Review of the MIP images confirms the above findings.

CTA ABDOMEN AND PELVIS FINDINGS

VASCULAR

Aorta: Status post surgical General of abdominal aortic aneurysm. The
graft and its limbs are widely patent.

Celiac: Moderate focal stenosis is noted at the origin secondary to
eccentric calcified plaque.

SMA: Patent without evidence of aneurysm, dissection, vasculitis or
significant stenosis.

Renals: Both renal arteries are patent without evidence of aneurysm,
dissection, vasculitis, fibromuscular dysplasia or significant
stenosis.

IMA: Patent without evidence of aneurysm, dissection, vasculitis or
significant stenosis.

Inflow: Patent without evidence of aneurysm, dissection, vasculitis
or significant stenosis.

Veins: No obvious venous abnormality within the limitations of this
arterial phase study.

Review of the MIP images confirms the above findings.

NON-VASCULAR

Hepatobiliary: No focal liver abnormality is seen. No gallstones,
gallbladder wall thickening, or biliary dilatation.

Pancreas: Unremarkable. No pancreatic ductal dilatation or
surrounding inflammatory changes.

Spleen: Normal in size without focal abnormality.

Adrenals/Urinary Tract: Adrenal glands are unremarkable. Kidneys are
normal, without renal calculi, focal lesion, or hydronephrosis.
Bladder is unremarkable.

Stomach/Bowel: Probable large gastric diverticulum is noted arising
posteriorly from the cardia which was present on prior exam.
Appendix appears normal. No evidence of bowel wall thickening,
distention, or inflammatory changes.

Lymphatic: No significant adenopathy is noted.

Reproductive: Prostate is unremarkable.

Other: No abdominal wall hernia or abnormality. No abdominopelvic
ascites.

Musculoskeletal: No acute or significant osseous findings.

Review of the MIP images confirms the above findings.
IMPRESSION: No evidence of thoracic or abdominal aortic dissection or aneurysm.

Status post surgical repair of infrarenal abdominal aortic aneurysm.
The graft and its limbs are widely patent.

Moderate focal stenosis is noted at origin of celiac artery
secondary to eccentric calcified plaque.

Coronary artery calcifications are noted suggesting coronary artery
disease.

Probable old sternal fracture is noted.

Probable large gastric diverticulum is noted arising posteriorly
from the cardia which was present on prior exam.

Aortic Atherosclerosis (B66U8-T6H.H) and Emphysema (B66U8-ZM1.9).

## 2023-03-18 DEATH — deceased
